# Patient Record
Sex: Male | Born: 1970 | Race: White | Hispanic: No | Marital: Married | Smoking: Former smoker
Health system: Southern US, Community
[De-identification: ages and names within clinical notes are randomized; demographics above are authoritative.]

## PROBLEM LIST (undated history)

## (undated) DIAGNOSIS — K219 Gastro-esophageal reflux disease without esophagitis: Secondary | ICD-10-CM

## (undated) DIAGNOSIS — Z86010 Personal history of colonic polyps: Secondary | ICD-10-CM

## (undated) DIAGNOSIS — Z860101 Personal history of adenomatous and serrated colon polyps: Secondary | ICD-10-CM

## (undated) DIAGNOSIS — G4700 Insomnia, unspecified: Secondary | ICD-10-CM

## (undated) DIAGNOSIS — I10 Essential (primary) hypertension: Secondary | ICD-10-CM

## (undated) DIAGNOSIS — R059 Cough, unspecified: Secondary | ICD-10-CM

## (undated) DIAGNOSIS — E785 Hyperlipidemia, unspecified: Secondary | ICD-10-CM

## (undated) DIAGNOSIS — T7840XA Allergy, unspecified, initial encounter: Secondary | ICD-10-CM

## (undated) HISTORY — DX: Essential (primary) hypertension: I10

## (undated) HISTORY — PX: FINGER SURGERY: SHX640

## (undated) HISTORY — DX: Insomnia, unspecified: G47.00

## (undated) HISTORY — DX: Hyperlipidemia, unspecified: E78.5

## (undated) HISTORY — DX: Personal history of adenomatous and serrated colon polyps: Z86.0101

## (undated) HISTORY — PX: WISDOM TOOTH EXTRACTION: SHX21

## (undated) HISTORY — DX: Gastro-esophageal reflux disease without esophagitis: K21.9

## (undated) HISTORY — DX: Allergy, unspecified, initial encounter: T78.40XA

## (undated) HISTORY — DX: Personal history of colonic polyps: Z86.010

## (undated) HISTORY — DX: Cough, unspecified: R05.9

---
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
## 2006-12-16 DIAGNOSIS — M8618 Other acute osteomyelitis, other site: Secondary | ICD-10-CM | POA: Insufficient documentation

## 2006-12-17 ENCOUNTER — Ambulatory Visit: Payer: Self-pay | Admitting: Infectious Diseases

## 2006-12-21 ENCOUNTER — Encounter: Payer: Self-pay | Admitting: Infectious Diseases

## 2006-12-25 ENCOUNTER — Telehealth: Payer: Self-pay | Admitting: Infectious Diseases

## 2016-05-15 ENCOUNTER — Other Ambulatory Visit: Payer: Self-pay | Admitting: Family Medicine

## 2016-05-15 DIAGNOSIS — R7989 Other specified abnormal findings of blood chemistry: Secondary | ICD-10-CM

## 2016-05-15 DIAGNOSIS — R945 Abnormal results of liver function studies: Principal | ICD-10-CM

## 2016-05-25 ENCOUNTER — Other Ambulatory Visit: Payer: Self-pay

## 2016-06-01 ENCOUNTER — Other Ambulatory Visit: Payer: Self-pay

## 2017-04-17 ENCOUNTER — Ambulatory Visit
Admission: RE | Admit: 2017-04-17 | Discharge: 2017-04-17 | Disposition: A | Discharge status: Home / Self Care | Payer: No Typology Code available for payment source | Source: Ambulatory Visit | Attending: Family Medicine | Admitting: Family Medicine

## 2017-04-17 DIAGNOSIS — R945 Abnormal results of liver function studies: Principal | ICD-10-CM

## 2017-04-17 DIAGNOSIS — R7989 Other specified abnormal findings of blood chemistry: Secondary | ICD-10-CM

## 2019-08-24 IMAGING — US US ABDOMEN LIMITED
1 series · 14 of 25 positions shown · non-contrast
Comparison: None.

CLINICAL DATA: Elevated LFTs

EXAM:
ULTRASOUND ABDOMEN LIMITED RIGHT UPPER QUADRANT

[Series 1: us abdomen limited · 0.23mm/px · 14 of 45 slices shown]
[im 1/45]
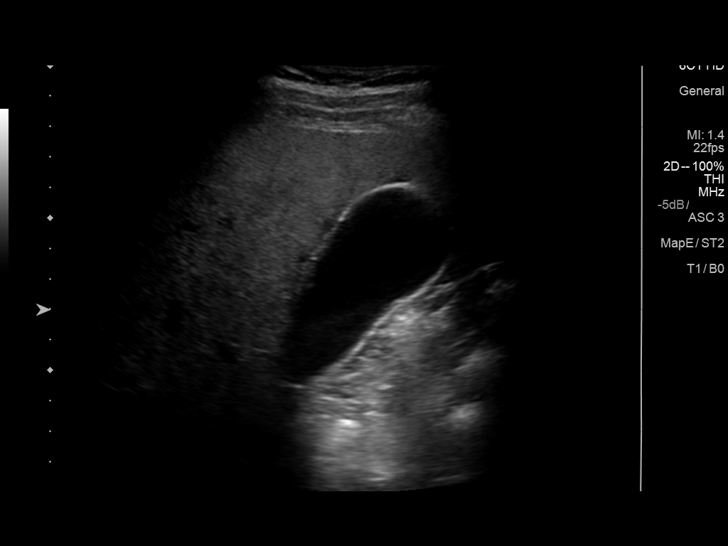
[im 4/45]
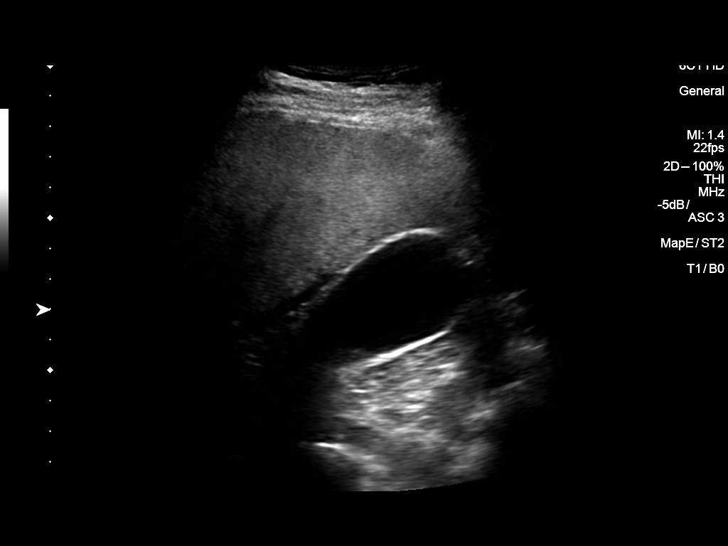
[im 8/45]
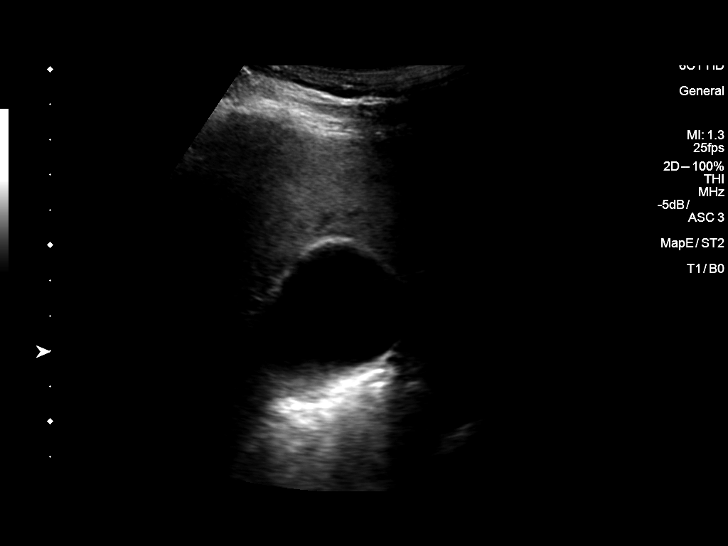
[im 12/45]
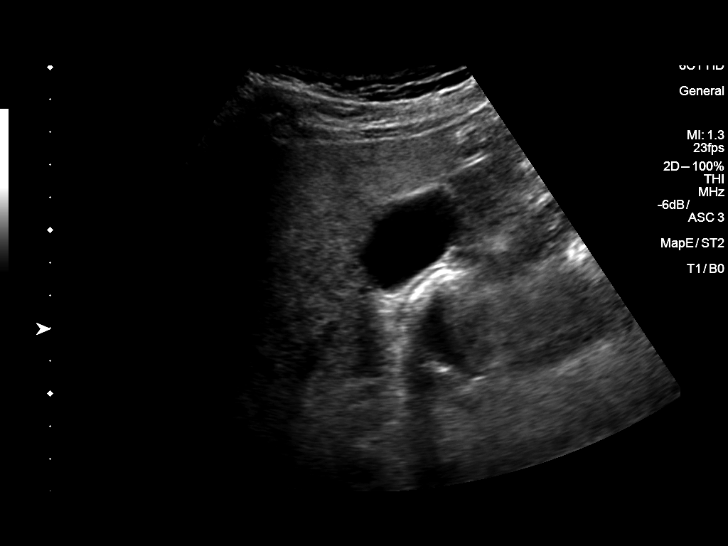
[im 15/45]
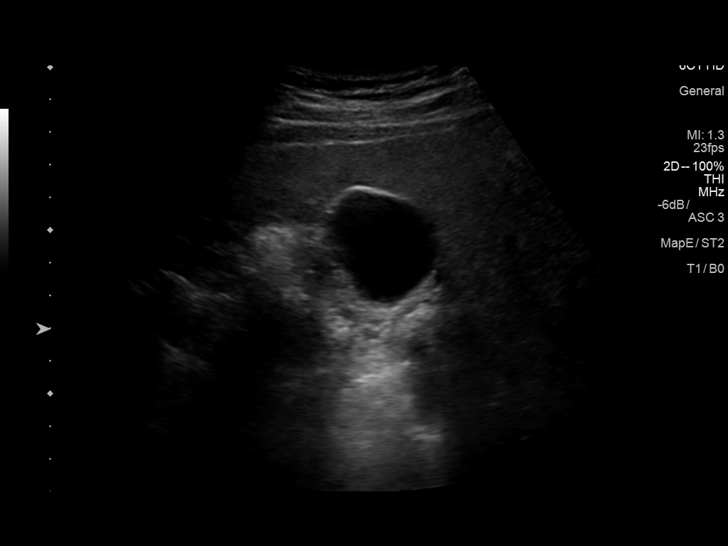
[im 17/45]
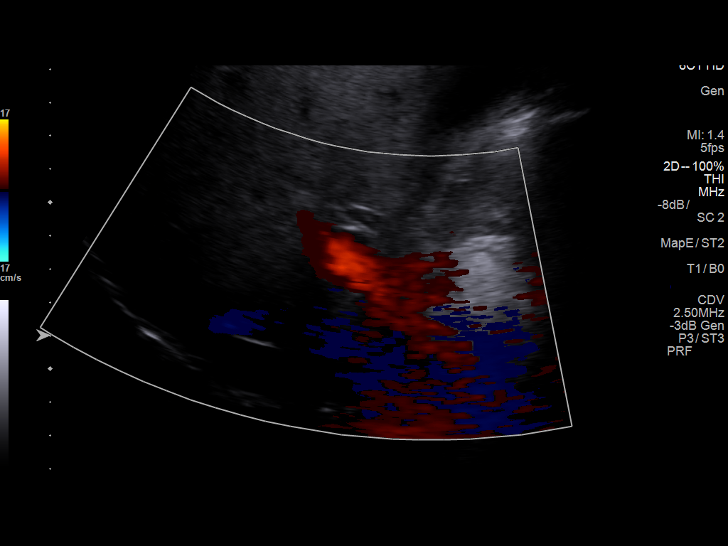
[im 21/45]
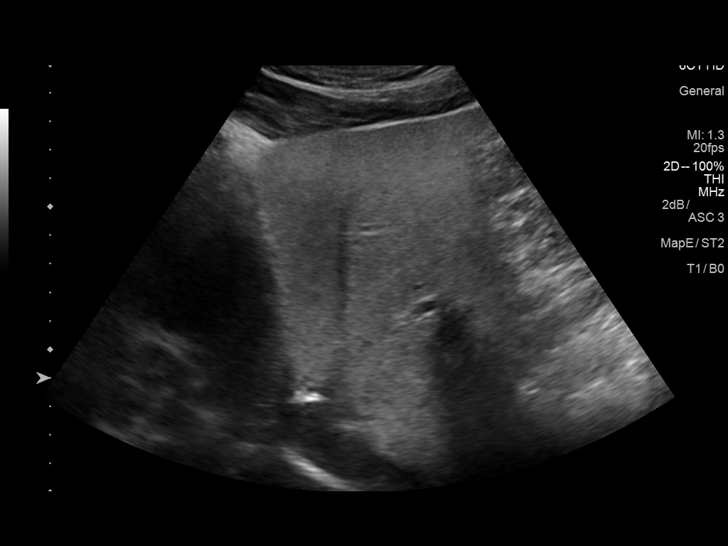
[im 24/45]
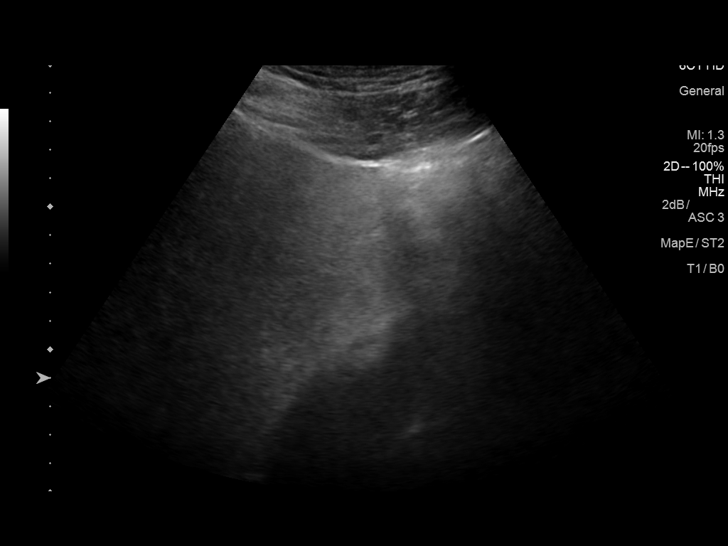
[im 28/45]
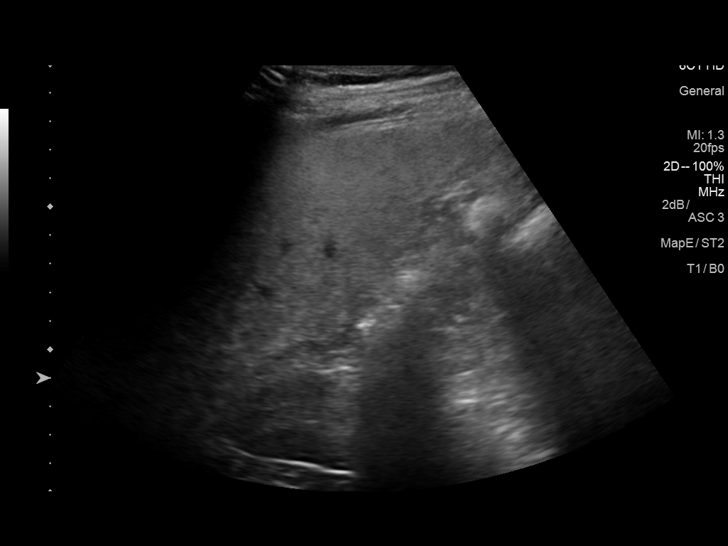
[im 30/45]
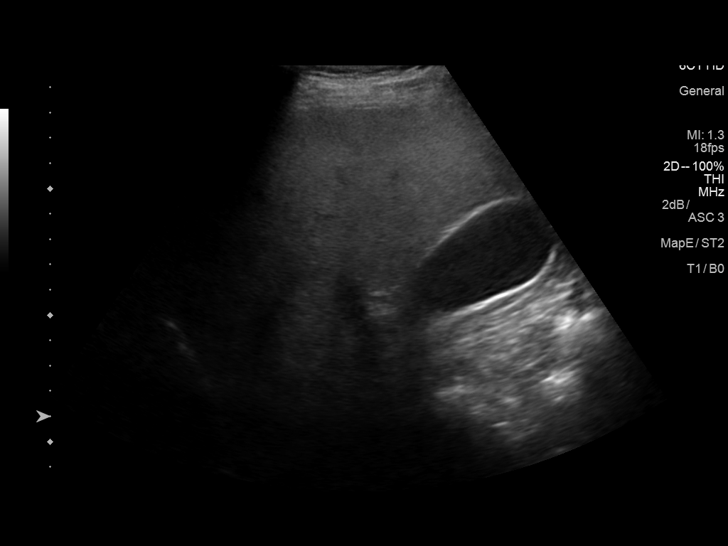
[im 34/45]
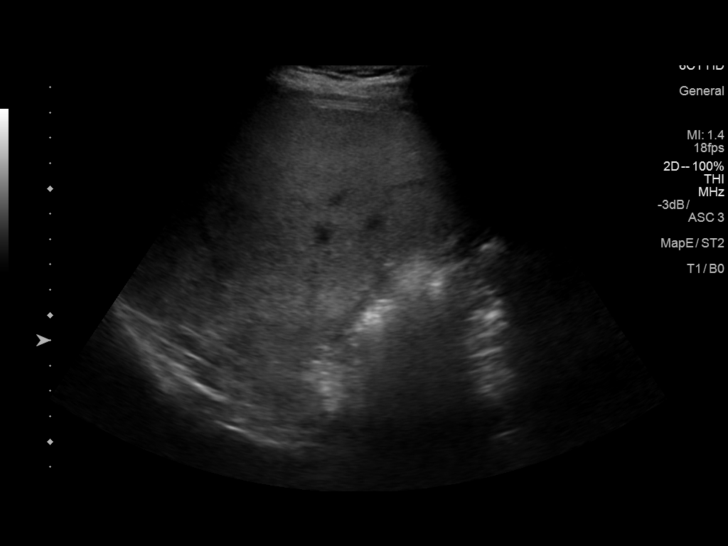
[im 37/45]
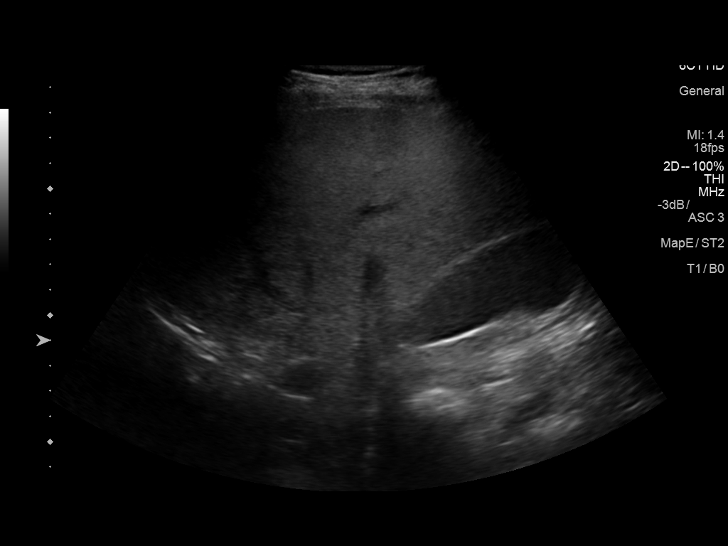
[im 41/45]
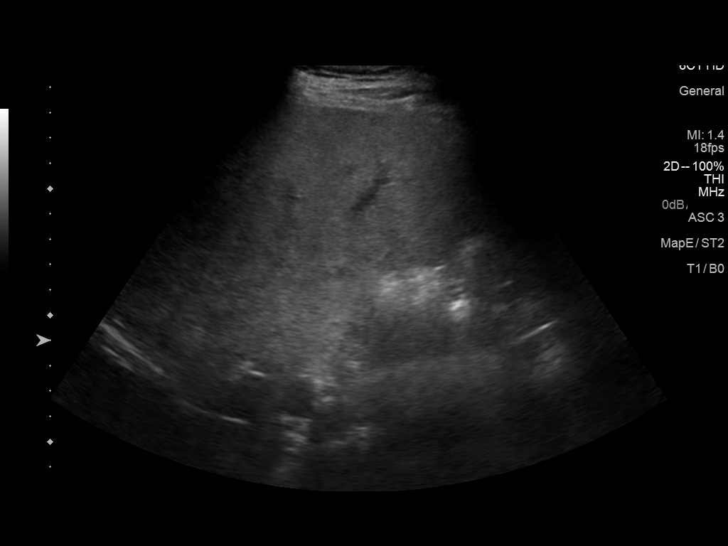
[im 45/45]
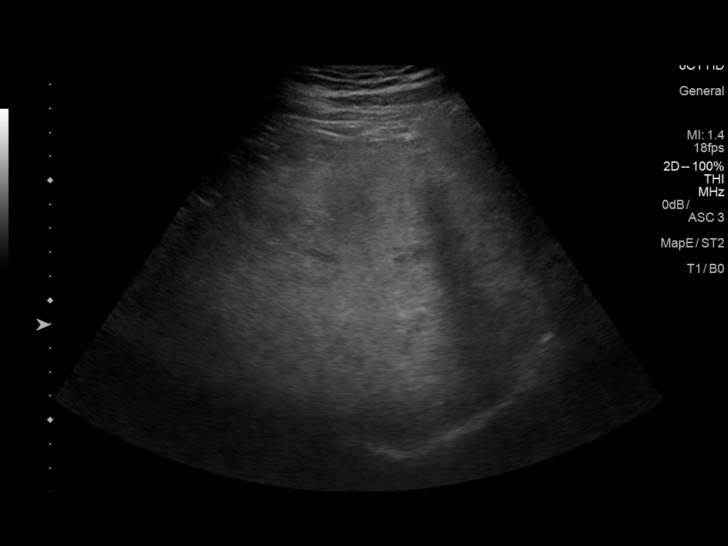

[14 of 25 positions shown; findings below may reference images not displayed]

FINDINGS: Gallbladder:

No gallstones or wall thickening visualized. No sonographic Murphy
sign noted by sonographer.

Common bile duct:

Diameter: 5.7 mm.

Liver:

Mild increased echogenicity is noted consistent with fatty
infiltration. No mass lesion is noted. Portal vein is patent on
color Doppler imaging with normal direction of blood flow towards
the liver.
IMPRESSION: Fatty liver.

## 2019-12-09 DIAGNOSIS — U071 COVID-19: Secondary | ICD-10-CM

## 2019-12-09 HISTORY — DX: COVID-19: U07.1

## 2020-01-10 LAB — COLOGUARD: COLOGUARD: POSITIVE — AB

## 2020-02-08 ENCOUNTER — Ambulatory Visit (INDEPENDENT_AMBULATORY_CARE_PROVIDER_SITE_OTHER): Payer: No Typology Code available for payment source

## 2020-02-08 ENCOUNTER — Encounter: Payer: Self-pay | Admitting: Pulmonary Disease

## 2020-02-08 ENCOUNTER — Other Ambulatory Visit: Payer: Self-pay

## 2020-02-08 ENCOUNTER — Ambulatory Visit (INDEPENDENT_AMBULATORY_CARE_PROVIDER_SITE_OTHER): Payer: No Typology Code available for payment source | Admitting: Pulmonary Disease

## 2020-02-08 VITALS — BP 124/74 | HR 93 | Temp 98.2°F | Ht 71.0 in | Wt 194.0 lb

## 2020-02-08 DIAGNOSIS — R059 Cough, unspecified: Secondary | ICD-10-CM

## 2020-02-08 NOTE — Patient Instructions (Signed)
Chronic cough  We will get a chest x-ray today Schedule pulmonary function test  We will consider CT scan of the chest if there is any abnormalities on the chest x-ray  Follow-up 4 to 6 weeks

## 2020-02-08 NOTE — Progress Notes (Signed)
Roberto Briggs    [id number]    1970/10/29  Primary Care Physician:Swayne, Shanon Brow, MD  Referring Physician: Antony Contras, MD [place],  [place] [address]  Chief complaint:   Chronic cough  HPI:  Patient has had a cough for over 10 years he stated Recently more concerned as his spouse has been telling him to have it checked out  Usually just cough in the mornings  Does have some history of reflux which usually does not bother him much unless he eats spicy foods Denies any nasal stuffiness or congestion No history of asthma or wheezing  Reformed smoker half a pack to 1 pack a day quit in 1998  No weight loss No history of hemoptysis  Dad did have lung cancer, heavy smoker  Denies any other ongoing chronic health problems  Outpatient Encounter Medications as of 02/08/2020  Medication Sig  . CRESTOR 10 MG tablet Take 10 mg by mouth daily.  Marland Kitchen olmesartan (BENICAR) 20 MG tablet Take 20 mg by mouth daily.  Marland Kitchen testosterone cypionate (DEPOTESTOTERONE CYPIONATE) 100 MG/ML injection ADMINISTER 1 ML IN THE MUSCLE EVERY 1 WEEK  . VASCEPA 1 g capsule Take 2 g by mouth 2 (two) times daily.   No facility-administered encounter medications on file as of 02/08/2020.    Allergies as of 02/08/2020 - Review Complete 02/08/2020  Allergen Reaction Noted  . Penicillins      No past medical history on file.  No family history on file.  Social History   Socioeconomic History  . Marital status: Married    Spouse name: Not on file  . Number of children: Not on file  . Years of education: Not on file  . Highest education level: Not on file  Occupational History  . Not on file  Tobacco Use  . Smoking status: Former Smoker    Quit date: 01/09/1996    Years since quitting: 24.0  . Smokeless tobacco: Never Used  Substance and Sexual Activity  . Alcohol use: Not on file  . Drug use: Not on file  . Sexual activity: Not on file  Other Topics Concern  .  Not on file  Social History Narrative  . Not on file   Social Determinants of Health   Financial Resource Strain: Not on file  Food Insecurity: Not on file  Transportation Needs: Not on file  Physical Activity: Not on file  Stress: Not on file  Social Connections: Not on file  Intimate Partner Violence: Not on file    Review of Systems  Constitutional: Negative for fatigue.  HENT: Negative.   Eyes: Negative.   Respiratory: Positive for cough. Negative for shortness of breath.   Cardiovascular: Negative.   Gastrointestinal: Negative.   Musculoskeletal: Negative.     Vitals:   02/08/20 1448  BP: 124/74  Pulse: 93  Temp: 98.2 F (36.8 C)  SpO2: 98%     Physical Exam Constitutional:      Appearance: Normal appearance.  HENT:     Head: Normocephalic.  Cardiovascular:     Rate and Rhythm: Normal rate and regular rhythm.     Pulses: Normal pulses.     Heart sounds: No murmur heard. No friction rub.  Pulmonary:     Effort: Pulmonary effort is normal. No respiratory distress.     Breath sounds: No stridor. No wheezing or rhonchi.  Musculoskeletal:     Cervical back: No rigidity or tenderness.  Neurological:     Mental Status: He is alert.  Psychiatric:        Mood and Affect: Mood normal.    Data Reviewed: No previous evaluation for the cough  Assessment:  Chronic cough -Reformed smoker -Family history of lung cancer in his dad who was a smoker as well -No symptoms to suggest postnasal drip or allergies -No history suggesting obstructive airway disease although with a history of smoking in the past  Reformed smoker  Plan/Recommendations: Obtain a chest x-ray -If any abnormality on the x-ray then a CT scan of the chest may be obtained  Obtain a pulmonary function test   Tentative follow-up in 4 weeks  Sherrilyn Rist MD [facility] 02/08/2020, 3:12 PM  CC: Antony Contras, MD

## 2020-02-25 ENCOUNTER — Encounter: Payer: Self-pay | Admitting: Internal Medicine

## 2020-03-08 ENCOUNTER — Ambulatory Visit (AMBULATORY_SURGERY_CENTER): Payer: No Typology Code available for payment source | Admitting: *Deleted

## 2020-03-08 ENCOUNTER — Other Ambulatory Visit: Payer: Self-pay

## 2020-03-08 VITALS — Ht 71.5 in | Wt 190.0 lb

## 2020-03-08 DIAGNOSIS — R195 Other fecal abnormalities: Secondary | ICD-10-CM

## 2020-03-08 HISTORY — PX: COLONOSCOPY: SHX5424

## 2020-03-08 NOTE — Progress Notes (Signed)
Pt verified name, DOB, address and insurance during PV today. Pt mailed instruction packet to included paper to complete and mail back to [facility] with addressed and stamped envelope, Emmi video, copy of consent form to read and not return, and instructions. PV completed over the phone.  Pt encouraged to call with questions or issues   No egg or soy allergy known to patient  No issues with past sedation with any surgeries or procedures No intubation problems in the past  No FH of Malignant Hyperthermia No diet pills per patient No home 02 use per patient  No blood thinners per patient  Pt denies issues with constipation  No A fib or A flutter  EMMI video to pt or via [place] 19 guidelines implemented in PV today with Pt and RN     Due to the COVID-19 pandemic we are asking patients to follow certain guidelines.  Pt aware of COVID protocols and [facility] guidelines

## 2020-03-10 ENCOUNTER — Encounter: Payer: Self-pay | Admitting: Internal Medicine

## 2020-03-15 ENCOUNTER — Telehealth: Payer: Self-pay | Admitting: Internal Medicine

## 2020-03-15 NOTE — Telephone Encounter (Signed)
Pt instructed Miralax works like Suprep as long as he follows the instructions given- pt verbalized understanding

## 2020-03-15 NOTE — Telephone Encounter (Signed)
Pt is requesting a call back from a nurse to ask if the Wilcox would work better for him than the dulcolax and miralax

## 2020-03-18 ENCOUNTER — Other Ambulatory Visit (HOSPITAL_COMMUNITY): Payer: No Typology Code available for payment source

## 2020-03-21 ENCOUNTER — Ambulatory Visit: Payer: No Typology Code available for payment source | Admitting: Pulmonary Disease

## 2020-03-22 ENCOUNTER — Encounter: Payer: Self-pay | Admitting: Internal Medicine

## 2020-03-22 ENCOUNTER — Other Ambulatory Visit: Payer: Self-pay

## 2020-03-22 ENCOUNTER — Ambulatory Visit (AMBULATORY_SURGERY_CENTER): Payer: No Typology Code available for payment source | Admitting: Internal Medicine

## 2020-03-22 VITALS — BP 100/74 | HR 67 | Temp 96.2°F | Resp 13 | Ht 71.0 in | Wt 190.0 lb

## 2020-03-22 DIAGNOSIS — R195 Other fecal abnormalities: Secondary | ICD-10-CM

## 2020-03-22 DIAGNOSIS — D123 Benign neoplasm of transverse colon: Secondary | ICD-10-CM

## 2020-03-22 DIAGNOSIS — K648 Other hemorrhoids: Secondary | ICD-10-CM

## 2020-03-22 DIAGNOSIS — K5730 Diverticulosis of large intestine without perforation or abscess without bleeding: Secondary | ICD-10-CM

## 2020-03-22 MED ORDER — SODIUM CHLORIDE 0.9 % IV SOLN
500.0000 mL | Freq: Once | INTRAVENOUS | Status: DC
Start: 2020-03-22 — End: 2020-03-22

## 2020-03-22 NOTE — Progress Notes (Signed)
Report to PACU, RN, vss, BBS= Clear.  

## 2020-03-22 NOTE — Progress Notes (Signed)
C.W. vital signs.

## 2020-03-22 NOTE — Progress Notes (Signed)
Pt's states no medical or surgical changes since previsit or office visit. 

## 2020-03-22 NOTE — Op Note (Signed)
[name] Patient Name: Roberto Briggs Procedure Date: 03/22/2020 9:08 AM MRN: [id number] Endoscopist: Gatha Mayer , MD Age: 50 Referring MD:  Date of Birth: Sep 04, 1970 Gender: Male Account #: [id number] Procedure:                Colonoscopy Indications:              Positive Cologuard test Medicines:                Propofol per Anesthesia, Monitored Anesthesia Care Procedure:                Pre-Anesthesia Assessment:                           - Prior to the procedure, a History and Physical                            was performed, and patient medications and                            allergies were reviewed. The patient's tolerance of                            previous anesthesia was also reviewed. The risks                            and benefits of the procedure and the sedation                            options and risks were discussed with the patient.                            All questions were answered, and informed consent                            was obtained. Prior Anticoagulants: The patient has                            taken no previous anticoagulant or antiplatelet                            agents. ASA Grade Assessment: II - A patient with                            mild systemic disease. After reviewing the risks                            and benefits, the patient was deemed in                            satisfactory condition to undergo the procedure.                           After obtaining informed consent, the colonoscope  was passed under direct vision. Throughout the                            procedure, the patient's blood pressure, pulse, and                            oxygen saturations were monitored continuously. The                            Olympus CF-HQ190L [id number]) Colonoscope was                            introduced through the anus and advanced to the the                            cecum, identified  by appendiceal orifice and                            ileocecal valve. The colonoscopy was performed                            without difficulty. The patient tolerated the                            procedure well. The quality of the bowel                            preparation was adequate. The ileocecal valve,                            appendiceal orifice, and rectum were photographed.                            The bowel preparation used was Miralax via split                            dose instruction. Scope In: 9:21:21 AM Scope Out: 9:39:12 AM Scope Withdrawal Time: 0 hours 15 minutes 15 seconds  Total Procedure Duration: 0 hours 17 minutes 51 seconds  Findings:                 The perianal and digital rectal examinations were                            normal. Pertinent negatives include normal prostate                            (size, shape, and consistency).                           A diminutive polyp was found in the transverse                            colon. The polyp was sessile. The polyp was removed  with a cold snare. Resection and retrieval were                            complete. Verification of patient identification                            for the specimen was done. Estimated blood loss was                            minimal.                           Multiple diverticula were found in the sigmoid                            colon.                           External hemorrhoids were found.                           The exam was otherwise without abnormality on                            direct and retroflexion views. Complications:            No immediate complications. Estimated Blood Loss:     Estimated blood loss was minimal. Impression:               - One diminutive polyp in the transverse colon,                            removed with a cold snare. Resected and retrieved.                           - Diverticulosis in the  sigmoid colon.                           - External hemorrhoids.                           - The examination was otherwise normal on direct                            and retroflexion views. Recommendation:           - Patient has a contact number available for                            emergencies. The signs and symptoms of potential                            delayed complications were discussed with the                            patient. Return to normal activities tomorrow.  Written discharge instructions were provided to the                            patient.                           - Resume previous diet.                           - Continue present medications.                           - Repeat colonoscopy is recommended. The                            colonoscopy date will be determined after pathology                            results from today's exam become available for                            review. Gatha Mayer, MD 03/22/2020 9:45:50 AM This report has been signed electronically.

## 2020-03-22 NOTE — Patient Instructions (Addendum)
I found and removed 1 tiny polyp.  There are some hemorrhoids that is probably what test positive.  You also have a condition called diverticulosis.  This is common and not typically a problem.  We have some explanatory handouts for you.  I will let you know pathology results and when to have another routine colonoscopy by mail and/or My Chart.  I appreciate the opportunity to care for you. Gatha Mayer, MD, [facility]  Handouts given for polyps, diverticulosis and hemorrhoids.   YOU HAD AN ENDOSCOPIC PROCEDURE TODAY AT [facility]:   Refer to the procedure report that was given to you for any specific questions about what was found during the examination.  If the procedure report does not answer your questions, please call your gastroenterologist to clarify.  If you requested that your care partner not be given the details of your procedure findings, then the procedure report has been included in a sealed envelope for you to review at your convenience later.  YOU SHOULD EXPECT: Some feelings of bloating in the abdomen. Passage of more gas than usual.  Walking can help get rid of the air that was put into your GI tract during the procedure and reduce the bloating. If you had a lower endoscopy (such as a colonoscopy or flexible sigmoidoscopy) you may notice spotting of blood in your stool or on the toilet paper. If you underwent a bowel prep for your procedure, you may not have a normal bowel movement for a few days.  Please Note:  You might notice some irritation and congestion in your nose or some drainage.  This is from the oxygen used during your procedure.  There is no need for concern and it should clear up in a day or so.  SYMPTOMS TO REPORT IMMEDIATELY:   Following lower endoscopy (colonoscopy or flexible sigmoidoscopy):  Excessive amounts of blood in the stool  Significant tenderness or worsening of abdominal pains  Swelling of the abdomen that is new, acute  Fever of  100F or higher  For urgent or emergent issues, a gastroenterologist can be reached at any hour by calling [phone]. Do not use MyChart messaging for urgent concerns.    DIET:  We do recommend a small meal at first, but then you may proceed to your regular diet.  Drink plenty of fluids but you should avoid alcoholic beverages for 24 hours.  ACTIVITY:  You should plan to take it easy for the rest of today and you should NOT DRIVE or use heavy machinery until tomorrow (because of the sedation medicines used during the test).    FOLLOW UP: Our staff will call the number listed on your records 48-72 hours following your procedure to check on you and address any questions or concerns that you may have regarding the information given to you following your procedure. If we do not reach you, we will leave a message.  We will attempt to reach you two times.  During this call, we will ask if you have developed any symptoms of COVID 19. If you develop any symptoms (ie: fever, flu-like symptoms, shortness of breath, cough etc.) before then, please call [phone].  If you test positive for Covid 19 in the 2 weeks post procedure, please call and report this information to [place].    If any biopsies were taken you will be contacted by phone or by letter within the next 1-3 weeks.  Please call us at [phone] if you have  not heard about the biopsies in 3 weeks.    SIGNATURES/CONFIDENTIALITY: You and/or your care partner have signed paperwork which will be entered into your electronic medical record.  These signatures attest to the fact that that the information above on your After Visit Summary has been reviewed and is understood.  Full responsibility of the confidentiality of this discharge information lies with you and/or your care-partner.

## 2020-03-24 ENCOUNTER — Telehealth: Payer: Self-pay

## 2020-03-24 ENCOUNTER — Telehealth: Payer: Self-pay | Admitting: *Deleted

## 2020-03-24 NOTE — Telephone Encounter (Signed)
  Follow up Call-  Call back number 03/22/2020  Post procedure Call Back phone  # [phone]  Permission to leave phone message Yes  Some recent data might be hidden     Patient questions:  Do you have a fever, pain , or abdominal swelling? No. Pain Score  0 *  Have you tolerated food without any problems? Yes.    Have you been able to return to your normal activities? Yes.    Do you have any questions about your discharge instructions: Diet   No. Medications  No. Follow up visit  No.  Do you have questions or concerns about your Care? No.  Actions: * If pain score is 4 or above: No action needed, pain <4.  1. Have you developed a fever since your procedure? no  2.   Have you had an respiratory symptoms (SOB or cough) since your procedure? no  3.   Have you tested positive for COVID 19 since your procedure no  4.   Have you had any family members/close contacts diagnosed with the COVID 19 since your procedure?  no   If yes to any of these questions please route to Joylene John, RN and Joella Prince, RN

## 2020-03-24 NOTE — Telephone Encounter (Deleted)
  Follow up Call-  Call back number 03/22/2020  Post procedure Call Back phone  # [phone]  Permission to leave phone message Yes  Some recent data might be hidden     Patient questions:  Do you have a fever, pain , or abdominal swelling? {yes no:314532} Pain Score  {NUMBERS; 0-10:5044} *  Have you tolerated food without any problems? {yes no:314532}  Have you been able to return to your normal activities? {yes no:314532}  Do you have any questions about your discharge instructions: Diet   {yes no:314532} Medications  {yes no:314532} Follow up visit  {yes no:314532}  Do you have questions or concerns about your Care? {yes no:314532}  Actions: * If pain score is 4 or above: {ACTION; [facility] ENDO PAIN >4:21563::"No action needed, pain <4."}

## 2020-03-24 NOTE — Telephone Encounter (Signed)
Called # [phone] and left a message we tried to reach pt for a follow up call. maw

## 2020-03-26 ENCOUNTER — Other Ambulatory Visit (HOSPITAL_COMMUNITY): Payer: No Typology Code available for payment source

## 2020-03-26 ENCOUNTER — Other Ambulatory Visit (HOSPITAL_COMMUNITY)
Admission: RE | Admit: 2020-03-26 | Discharge: 2020-03-26 | Disposition: A | Discharge status: Home / Self Care | Payer: No Typology Code available for payment source | Source: Ambulatory Visit | Attending: Pulmonary Disease | Admitting: Pulmonary Disease

## 2020-03-26 DIAGNOSIS — U071 COVID-19: Secondary | ICD-10-CM | POA: Diagnosis not present

## 2020-03-26 DIAGNOSIS — Z01812 Encounter for preprocedural laboratory examination: Secondary | ICD-10-CM | POA: Insufficient documentation

## 2020-03-26 LAB — SARS CORONAVIRUS 2 (TAT 6-24 HRS): SARS Coronavirus 2: POSITIVE — AB

## 2020-03-27 ENCOUNTER — Telehealth: Payer: Self-pay | Admitting: Physician Assistant

## 2020-03-27 ENCOUNTER — Telehealth: Payer: Self-pay | Admitting: Pulmonary Disease

## 2020-03-27 NOTE — Telephone Encounter (Signed)
Called to discuss with patient about COVID-19 symptoms and the use of one of the available treatments for those with mild to moderate Covid symptoms and at a high risk of hospitalization.  Pt appears to qualify for outpatient treatment due to co-morbid conditions and/or a member of an at-risk group in accordance with the FDA Emergency Use Authorization.   Unable to reach pt. LVMTCB.  [employer]

## 2020-03-27 NOTE — Telephone Encounter (Signed)
Notified by [facility] lab that the patient tested positive for COVID. Has been contacted already by outpatient treatment center. Routing to pulmonary office as he is scheduled for PFT which will need to be rescheduled

## 2020-03-27 NOTE — Progress Notes (Deleted)
Notified Dr. Lake Bells, on call for Dr. Ander Slade, of patient's positive covid test.

## 2020-03-28 NOTE — Telephone Encounter (Signed)
Aware  Please reschedule PFT

## 2020-03-28 NOTE — Telephone Encounter (Signed)
Called spoke with patient He was okay following up with a NP patient has been rescheduled for June per office protocol.   Nothing further needed at this time.

## 2020-03-30 ENCOUNTER — Ambulatory Visit: Payer: No Typology Code available for payment source | Admitting: Pulmonary Disease

## 2020-04-01 ENCOUNTER — Encounter: Payer: Self-pay | Admitting: Internal Medicine

## 2020-07-01 ENCOUNTER — Other Ambulatory Visit (HOSPITAL_COMMUNITY): Payer: No Typology Code available for payment source

## 2020-07-04 ENCOUNTER — Ambulatory Visit: Payer: No Typology Code available for payment source | Admitting: Primary Care

## 2021-02-14 ENCOUNTER — Ambulatory Visit (INDEPENDENT_AMBULATORY_CARE_PROVIDER_SITE_OTHER): Payer: No Typology Code available for payment source | Admitting: Medical

## 2021-02-14 ENCOUNTER — Other Ambulatory Visit: Payer: Self-pay

## 2021-02-14 ENCOUNTER — Encounter: Payer: Self-pay | Admitting: Medical

## 2021-02-14 VITALS — BP 134/88 | HR 78 | Temp 98.5°F | Ht 71.25 in | Wt 193.8 lb

## 2021-02-14 DIAGNOSIS — R053 Chronic cough: Secondary | ICD-10-CM | POA: Diagnosis not present

## 2021-02-14 DIAGNOSIS — Z801 Family history of malignant neoplasm of trachea, bronchus and lung: Secondary | ICD-10-CM | POA: Diagnosis not present

## 2021-02-14 DIAGNOSIS — R9389 Abnormal findings on diagnostic imaging of other specified body structures: Secondary | ICD-10-CM

## 2021-02-14 DIAGNOSIS — I10 Essential (primary) hypertension: Secondary | ICD-10-CM

## 2021-02-14 DIAGNOSIS — Z87891 Personal history of nicotine dependence: Secondary | ICD-10-CM

## 2021-02-14 DIAGNOSIS — E785 Hyperlipidemia, unspecified: Secondary | ICD-10-CM

## 2021-02-14 MED ORDER — BENZONATATE 200 MG PO CAPS: 200.0000 mg | ORAL_CAPSULE | Freq: Three times a day (TID) | ORAL | 0 refills | Status: DC | PRN

## 2021-02-14 NOTE — Progress Notes (Addendum)
Subjective:  Roberto Briggs is a 51 y.o. male who presents for Chief Complaint  Patient presents with   other    New pt. Est. Ongoing cough since having last covid back in 12/21, may want lung cancer screening dad pasted last 9/22 from lung cancer 2nd hand smoke form parents growing up     Here as a new patient.  Was seeing [facility] prior.  Medical team: Urology, Dr. Louis Meckel  He notes ongoing cough.  Had covid 12/2019.  Has had some ongoing cough since a year ago.   Worse in mornings, then it calms down but flares up again in afternoon.   Has had chest xray in past year that was clear.   He notes hx/o 2nd hand tobacco exposure, would like lung cancer screen.  Father just passed of lung cancer, diagnosed in 2021, had metastases.  He is a former smoke, but quit in 1998.     Denies frequency GERD issues, only occasionally with spicy food.   Gets some runny nose sometimes.  No other allergy symptoms  Works for home in [place].  Low testosterone, sees urology.  Uses testosterone injection every 10 days  Hypertension - compliant with Olmesartan 36m daily, increased this past year. Usually around 122-124/80.  No other aggravating or relieving factors.    No other c/o.  Past Medical History:  Diagnosis Date   Allergy    Cough    in the am's only- to see Pulmonology    COVID-19 12/2019   GERD (gastroesophageal reflux disease)    OCC    Hx of adenomatous polyp of colon    diminutive recall 2029   Hyperlipidemia    controlled on meds    Hypertension    Controlled with meds    Current Outpatient Medications on File Prior to Visit  Medication Sig Dispense Refill   BD DISP NEEDLES 22G X 1-1/2" MISC      CRESTOR 10 MG tablet Take 10 mg by mouth daily.     Multiple Vitamin (MULTIVITAMIN ADULT PO)      olmesartan (BENICAR) 40 MG tablet Take 40 mg by mouth daily.     sildenafil (REVATIO) 20 MG tablet      testosterone cypionate (DEPOTESTOSTERONE CYPIONATE) 200 MG/ML injection Inject 100  mg into the muscle once a week. On Sunday     ALPRAZolam (XANAX) 0.25 MG tablet  (Patient not taking: Reported on 03/08/2020)     VASCEPA 1 g capsule Take 2 g by mouth 2 (two) times daily. (Patient not taking: Reported on 02/14/2021)     zolpidem (AMBIEN) 10 MG tablet  (Patient not taking: Reported on 03/08/2020)     No current facility-administered medications on file prior to visit.     The following portions of the patient's history were reviewed and updated as appropriate: allergies, current medications, past family history, past medical history, past social history, past surgical history and problem list.  ROS Otherwise as in subjective above    Objective: BP 134/88    Pulse 78    Temp 98.5 F (36.9 C)    Ht 5' 11.25" (1.81 m)    Wt 193 lb 12.8 oz (87.9 kg)    SpO2 98%    BMI 26.84 kg/m   General appearance: alert, no distress, well developed, well nourished HEENT: normocephalic, sclerae anicteric, conjunctiva pink and moist, TMs pearly, nares patent, no discharge or erythema, pharynx normal Oral cavity: MMM, no lesions Neck: supple, no lymphadenopathy, no thyromegaly, no masses  Heart: RRR, normal S1, S2, no murmurs Lungs: CTA bilaterally, no wheezes, rhonchi, or rales Pulses: 2+ radial pulses, 2+ pedal pulses, normal cap refill Ext: no edema   Assessment: Encounter Diagnoses  Name Primary?   Chronic cough Yes   Abnormal chest x-ray    Former smoker    Family history of lung cancer    Essential hypertension, benign    Hyperlipidemia, unspecified hyperlipidemia type      Plan: Chronic cough, abnormal chest x-ray about a year ago.  We discussed his concerns, family history of lung cancer.  It is not obvious which trigger is causing the cough currently.  We will go ahead and proceed with CT chest.  He is a former smoker.  He has no significant current symptoms for allergies or acid reflux.  He has no work exposures of concern.  Can use Tessalon Perles as needed.  Follow-up  pending CT  Hypertension-compliant with medication  Hyperlipidemia-compliant with medication  Low testosterone-managed by urology  He is signing for prior records to be sent over including last physical notes and labs   [name] was seen today for other.  Diagnoses and all orders for this visit:  Chronic cough -     CT Chest W Contrast; Future -     Spirometry with graph; Future -     Spirometry with graph  Abnormal chest x-ray -     CT Chest W Contrast; Future  Former smoker -     CT Chest W Contrast; Future  Family history of lung cancer -     CT Chest W Contrast; Future  Essential hypertension, benign  Hyperlipidemia, unspecified hyperlipidemia type  Other orders -     benzonatate (TESSALON) 200 MG capsule; Take 1 capsule (200 mg total) by mouth 3 (three) times daily as needed for cough.    Follow up: pending CT

## 2021-02-23 ENCOUNTER — Telehealth: Payer: Self-pay | Admitting: Medical

## 2021-02-23 NOTE — Telephone Encounter (Signed)
Received records from [facility]

## 2021-03-03 ENCOUNTER — Ambulatory Visit
Admission: RE | Admit: 2021-03-03 | Discharge: 2021-03-03 | Disposition: A | Discharge status: Home / Self Care | Payer: No Typology Code available for payment source | Source: Ambulatory Visit | Attending: Medical | Admitting: Medical

## 2021-03-03 DIAGNOSIS — R053 Chronic cough: Secondary | ICD-10-CM

## 2021-03-03 DIAGNOSIS — Z87891 Personal history of nicotine dependence: Secondary | ICD-10-CM

## 2021-03-03 DIAGNOSIS — Z801 Family history of malignant neoplasm of trachea, bronchus and lung: Secondary | ICD-10-CM

## 2021-03-03 DIAGNOSIS — R9389 Abnormal findings on diagnostic imaging of other specified body structures: Secondary | ICD-10-CM

## 2021-03-03 MED ORDER — IOPAMIDOL (ISOVUE-300) INJECTION 61%
75.0000 mL | Freq: Once | INTRAVENOUS | Status: AC | PRN
  Administered 2021-03-03: 75 mL via INTRAVENOUS

## 2021-03-16 NOTE — Telephone Encounter (Signed)
Roberto Briggs- can you check to see if records came in on this patient? ?

## 2021-03-17 ENCOUNTER — Other Ambulatory Visit: Payer: Self-pay

## 2021-03-17 ENCOUNTER — Other Ambulatory Visit: Payer: Self-pay | Admitting: Medical

## 2021-03-17 MED ORDER — ATORVASTATIN CALCIUM 20 MG PO TABS: 20.0000 mg | ORAL_TABLET | Freq: Every day | ORAL | 3 refills | Status: DC

## 2021-03-20 ENCOUNTER — Other Ambulatory Visit: Payer: Self-pay | Admitting: Medical

## 2021-03-20 ENCOUNTER — Telehealth: Payer: Self-pay | Admitting: Family Medicine

## 2021-03-20 MED ORDER — ROSUVASTATIN CALCIUM 20 MG PO TABS: 20.0000 mg | ORAL_TABLET | Freq: Every day | ORAL | 0 refills | Status: DC

## 2021-03-20 MED ORDER — CRESTOR 20 MG PO TABS: 20.0000 mg | ORAL_TABLET | Freq: Every day | ORAL | 0 refills | Status: DC

## 2021-03-20 NOTE — Telephone Encounter (Signed)
Patient called and DOES NOT LIKE LIPITOR.  He wants Crestor and it needs to be written DAW. ?He thinks you increased his dose to 20 mg instead of 27m.  Please [facility] and [facility].  I have sched cpe in July. ?

## 2021-03-20 NOTE — Telephone Encounter (Signed)
Please advise as lipitor was sent in last week by kim ? ?

## 2021-05-31 ENCOUNTER — Other Ambulatory Visit: Payer: Self-pay | Admitting: Medical

## 2021-07-31 ENCOUNTER — Encounter: Payer: Self-pay | Admitting: Medical

## 2021-07-31 ENCOUNTER — Ambulatory Visit (INDEPENDENT_AMBULATORY_CARE_PROVIDER_SITE_OTHER): Payer: No Typology Code available for payment source | Admitting: Medical

## 2021-07-31 VITALS — BP 120/80 | HR 81 | Wt 193.4 lb

## 2021-07-31 DIAGNOSIS — G4700 Insomnia, unspecified: Secondary | ICD-10-CM | POA: Diagnosis not present

## 2021-07-31 DIAGNOSIS — E785 Hyperlipidemia, unspecified: Secondary | ICD-10-CM | POA: Diagnosis not present

## 2021-07-31 DIAGNOSIS — Z0000 Encounter for general adult medical examination without abnormal findings: Secondary | ICD-10-CM

## 2021-07-31 DIAGNOSIS — Z125 Encounter for screening for malignant neoplasm of prostate: Secondary | ICD-10-CM

## 2021-07-31 DIAGNOSIS — I10 Essential (primary) hypertension: Secondary | ICD-10-CM

## 2021-07-31 DIAGNOSIS — N5201 Erectile dysfunction due to arterial insufficiency: Secondary | ICD-10-CM

## 2021-07-31 DIAGNOSIS — R7989 Other specified abnormal findings of blood chemistry: Secondary | ICD-10-CM

## 2021-07-31 DIAGNOSIS — Z7185 Encounter for immunization safety counseling: Secondary | ICD-10-CM | POA: Insufficient documentation

## 2021-07-31 LAB — POCT URINALYSIS DIP (PROADVANTAGE DEVICE)
Bilirubin, UA: NEGATIVE
Blood, UA: NEGATIVE
Glucose, UA: NEGATIVE mg/dL
Ketones, POC UA: NEGATIVE mg/dL
Leukocytes, UA: NEGATIVE
Nitrite, UA: NEGATIVE
Protein Ur, POC: NEGATIVE mg/dL
Specific Gravity, Urine: 1.01
Urobilinogen, Ur: 0.2
pH, UA: 6 (ref 5.0–8.0)

## 2021-07-31 NOTE — Progress Notes (Signed)
Subjective:   HPI  Roberto Briggs is a 51 y.o. male who presents for Chief Complaint  Patient presents with   Annual Exam    Fasting Cpe- no other concerns    Patient Care Team: Olina Melfi, Leward Quan as PCP - General (Family Medicine) Sees dentist Sees eye doctor Dr. Silvano Rusk, GI Dr. 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  No complaints of medication.  He still might want to go back and do a CT coronary test  He notes that he uses Ambien and Xanax for travel.  This is rare use.  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  His last injection was about 10 days ago.  Sees urology next month for yearly follow-up  He is curious about Berberine supplement for triglycerides.  He has been on other things in the past that did not help all that much which had triglycerides and prescription fish oil was very expensive  Reviewed their medical, surgical, family, social, medication, and allergy history and updated chart as appropriate.  Past Medical History:  Diagnosis Date   Allergy    Cough    in the am's only- to see Pulmonology    COVID-19 12/2019   GERD (gastroesophageal reflux disease)    OCC    Hx of adenomatous polyp of colon    diminutive recall 2029   Hyperlipidemia    controlled on meds    Hypertension    Controlled with meds     Past Surgical History:  Procedure Laterality Date   EYE SURGERY  1982   lazy eye    FINGER SURGERY     ~2008 or 2009   WISDOM TOOTH EXTRACTION      Family History  Problem Relation Age of Onset   Prostate cancer Maternal Grandfather        could be colon - pt unsure    Colon polyps Father    Lung cancer Father    Colon cancer Neg Hx    Esophageal cancer Neg Hx    Stomach cancer Neg Hx     Rectal cancer Neg Hx      Current Outpatient Medications:    ALPRAZolam (XANAX) 0.25 MG tablet, , Disp: , Rfl:    BD DISP NEEDLES 22G X 1-1/2" MISC, , Disp: , Rfl:    CRESTOR 20 MG tablet, TAKE 1 TABLET DAILY, Disp: 90 tablet, Rfl: 0   Multiple Vitamin (MULTIVITAMIN ADULT PO), , Disp: , Rfl:    olmesartan (BENICAR) 40 MG tablet, Take 40 mg by mouth daily., Disp: , Rfl:    sildenafil (REVATIO) 20 MG tablet, , Disp: , Rfl:    testosterone cypionate (DEPOTESTOSTERONE CYPIONATE) 200 MG/ML injection, Inject 100 mg into the muscle once a week. On Sunday, Disp: , Rfl:    zolpidem (AMBIEN) 10 MG tablet, , Disp: , Rfl:   Allergies  Allergen Reactions   Penicillins    Sertraline Hcl Other (See Comments)    Didn't tolerate     Review of Systems Constitutional: -fever, -chills, -sweats, -unexpected weight change, -decreased appetite, -fatigue Allergy: -sneezing, -itching, -congestion Dermatology: -changing moles, --rash, -lumps ENT: -runny nose, -ear pain, -sore throat, -hoarseness, -sinus pain, -teeth pain, - ringing in ears, -hearing loss, -nosebleeds Cardiology: -chest pain, -palpitations, -swelling, -difficulty breathing when  lying flat, -waking up short of breath Respiratory: -cough, -shortness of breath, -difficulty breathing with exercise or exertion, -wheezing, -coughing up blood Gastroenterology: -abdominal pain, -nausea, -vomiting, -diarrhea, -constipation, -blood in stool, -changes in bowel movement, -difficulty swallowing or eating Hematology: -bleeding, -bruising  Musculoskeletal: -joint aches, -muscle aches, -joint swelling, -back pain, -neck pain, -cramping, -changes in gait Ophthalmology: denies vision changes, eye redness, itching, discharge Urology: -burning with urination, -difficulty urinating, -blood in urine, -urinary frequency, -urgency, -incontinence Neurology: -headache, -weakness, -tingling, -numbness, -memory loss, -falls, -dizziness Psychology: -depressed mood,  -agitation, -sleep problems Male GU: no testicular mass, pain, no lymph nodes swollen, no swelling, no rash.     07/31/2021    8:51 AM 02/17/2021    8:28 AM  Depression screen PHQ 2/9  Decreased Interest 0 0  Down, Depressed, Hopeless 0 0  PHQ - 2 Score 0 0        Objective:  BP 120/80   Pulse 81   Wt 193 lb 6.4 oz (87.7 kg)   SpO2 98%   BMI 26.78 kg/m   General appearance: alert, no distress, WD/WN, Caucasian male Skin: scattered macules, no worrisome lesions HEENT: normocephalic, conjunctiva/corneas normal, sclerae anicteric, PERRLA, EOMi, nares patent, no discharge or erythema, pharynx normal Neck: supple, no lymphadenopathy, no thyromegaly, no masses, normal ROM, no bruits Chest: non tender, normal shape and expansion Heart: RRR, normal S1, S2, no murmurs Lungs: CTA bilaterally, no wheezes, rhonchi, or rales Abdomen: +bs, soft, non tender, non distended, no masses, no hepatomegaly, no splenomegaly, no bruits Back: non tender, normal ROM, no scoliosis Musculoskeletal: upper extremities non tender, no obvious deformity, normal ROM throughout, lower extremities non tender, no obvious deformity, normal ROM throughout Extremities: no edema, no cyanosis, no clubbing Pulses: 2+ symmetric, upper and lower extremities, normal cap refill Neurological: alert, oriented x 3, CN2-12 intact, strength normal upper extremities and lower extremities, sensation normal throughout, DTRs 2+ throughout, no cerebellar signs, gait normal Psychiatric: normal affect, behavior normal, pleasant  GU: normal male external genitalia,circumcised, nontender, no masses, no hernia, no lymphadenopathy Rectal: deferred to urology   Assessment and Plan :   Encounter Diagnoses  Name Primary?   Encounter for health maintenance examination in adult Yes   Hyperlipidemia, unspecified hyperlipidemia type    Screening for prostate cancer    Insomnia, unspecified type    Vaccine counseling    Low testosterone     Primary hypertension    Erectile dysfunction due to arterial insufficiency     This visit was a preventative care visit, also known as wellness visit or routine physical.   Topics typically include healthy lifestyle, diet, exercise, preventative care, vaccinations, sick and well care, proper use of emergency dept and after hours care, as well as other concerns.     Recommendations: Continue to return yearly for your annual wellness and preventative care visits.  This gives [place] a chance to discuss healthy lifestyle, exercise, vaccinations, review your chart record, and perform screenings where appropriate.  I recommend you see your eye doctor yearly for routine vision care.  I recommend you see your dentist yearly for routine dental care including hygiene visits twice yearly.   Vaccination recommendations were reviewed  There is no immunization history on file for this patient.  Advised yearly flu shot, updated Tdap, Shingrix age 33  He declines Tdap today   Screening for cancer: Colon cancer screening: I reviewed your colonoscopy on file that is up to date from 2022, repeat 2029.  We discussed PSA, prostate  exam, and prostate cancer screening risks/benefits.     Skin cancer screening: Check your skin regularly for new changes, growing lesions, or other lesions of concern Come in for evaluation if you have skin lesions of concern.  Lung cancer screening: If you have a greater than 20 pack year history of tobacco use, then you may qualify for lung cancer screening with a chest CT scan.   Please call your insurance company to inquire about coverage for this test.  We currently don't have screenings for other cancers besides breast, cervical, colon, and lung cancers.  If you have a strong family history of cancer or have other cancer screening concerns, please let me know.    Bone health: Get at least 150 minutes of aerobic exercise weekly Get weight bearing exercise at least  once weekly Bone density test:  A bone density test is an imaging test that uses a type of X-ray to measure the amount of calcium and other minerals in your bones. The test may be used to diagnose or screen you for a condition that causes weak or thin bones (osteoporosis), predict your risk for a broken bone (fracture), or determine how well your osteoporosis treatment is working. The bone density test is recommended for females 70 and older, or females or males <03 if certain risk factors such as thyroid disease, long term use of steroids such as for asthma or rheumatological issues, vitamin D deficiency, estrogen deficiency, family history of osteoporosis, self or family history of fragility fracture in first degree relative.    Heart health: Get at least 150 minutes of aerobic exercise weekly Limit alcohol It is important to maintain a healthy blood pressure and healthy cholesterol numbers  Heart disease screening: Screening for heart disease includes screening for blood pressure, fasting lipids, glucose/diabetes screening, BMI height to weight ratio, reviewed of smoking status, physical activity, and diet.    Goals include blood pressure 120/80 or less, maintaining a healthy lipid/cholesterol profile, preventing diabetes or keeping diabetes numbers under good control, not smoking or using tobacco products, exercising most days per week or at least 150 minutes per week of exercise, and eating healthy variety of fruits and vegetables, healthy oils, and avoiding unhealthy food choices like fried food, fast food, high sugar and high cholesterol foods.    Other tests may possibly include EKG test, CT coronary calcium score, echocardiogram, exercise treadmill stress test.   CT chest 2023 shows very small punctate area of calcifications in the aortic arch, no other atherosclerosis noted   Medical care options: I recommend you continue to seek care here first for routine care.  We try really hard  to have available appointments Monday through Friday daytime hours for sick visits, acute visits, and physicals.  Urgent care should be used for after hours and weekends for significant issues that cannot wait till the next day.  The emergency department should be used for significant potentially life-threatening emergencies.  The emergency department is expensive, can often have long wait times for less significant concerns, so try to utilize primary care, urgent care, or telemedicine when possible to avoid unnecessary trips to the emergency department.  Virtual visits and telemedicine have been introduced since the pandemic started in 2020, and can be convenient ways to receive medical care.  We offer virtual appointments as well to assist you in a variety of options to seek medical care.   Advanced Directives: I recommend you consider completing a [place] and Living Will.  These documents respect your wishes and help alleviate burdens on your loved ones if you were to become terminally ill or be in a position to need those documents enforced.    You can complete Advanced Directives yourself, have them notarized, then have copies made for our office, for you and for anybody you feel should have them in safe keeping.  Or, you can have an attorney prepare these documents.   If you haven't updated your Last Will and Testament in a while, it may be worthwhile having an attorney prepare these documents together and save on some costs.       Separate significant issues discussed: Hypertension-continue current medication  Hyperlipidemia-compliant with Crestor 20 mg.  Updated fasting labs today.  Counseled on lipids and high triglycerides and possible treatment recommendations.  I will research the berberine supplement he was referring to.  Travel anxiety and insomnia-uses Xanax and Ambien rarely for travel  Low testosterone-labs today to be forwarded to [facility] urology who  manages his testosterone  Of note, I reviewed prior records from [place] from physical a year ago in 2022.  At that time he had triglycerides around 202, total cholesterol 170, AST slightly elevated at 43, bilirubin at 1.4 elevated, otherwise comprehensive metabolic panel normal, PSA 1.17   Paolo was seen today for annual exam.  Diagnoses and all orders for this visit:  Encounter for health maintenance examination in adult -     Comprehensive metabolic panel -     CBC -     Lipid panel -     POCT Urinalysis DIP (Proadvantage Device) -     PSA -     HIV Antibody (routine testing w rflx) -     Hepatitis C antibody -     Testosterone  Hyperlipidemia, unspecified hyperlipidemia type -     Lipid panel  Screening for prostate cancer -     PSA  Insomnia, unspecified type  Vaccine counseling  Low testosterone -     Testosterone  Primary hypertension  Erectile dysfunction due to arterial insufficiency   Follow-up pending labs, yearly for physical

## 2021-08-01 LAB — CBC
Hematocrit: 50.9 % (ref 37.5–51.0)
Hemoglobin: 17.1 g/dL (ref 13.0–17.7)
MCH: 32.3 pg (ref 26.6–33.0)
MCHC: 33.6 g/dL (ref 31.5–35.7)
MCV: 96 fL (ref 79–97)
Platelets: 182 10*3/uL (ref 150–450)
RBC: 5.29 x10E6/uL (ref 4.14–5.80)
RDW: 12.8 % (ref 11.6–15.4)
WBC: 5.3 10*3/uL (ref 3.4–10.8)

## 2021-08-01 LAB — COMPREHENSIVE METABOLIC PANEL
ALT: 44 IU/L (ref 0–44)
AST: 37 IU/L (ref 0–40)
Albumin/Globulin Ratio: 2.3 — ABNORMAL HIGH (ref 1.2–2.2)
Albumin: 4.9 g/dL (ref 4.1–5.1)
Alkaline Phosphatase: 49 IU/L (ref 44–121)
BUN/Creatinine Ratio: 9 (ref 9–20)
BUN: 9 mg/dL (ref 6–24)
Bilirubin Total: 0.9 mg/dL (ref 0.0–1.2)
CO2: 22 mmol/L (ref 20–29)
Calcium: 9.9 mg/dL (ref 8.7–10.2)
Chloride: 99 mmol/L (ref 96–106)
Creatinine, Ser: 1.01 mg/dL (ref 0.76–1.27)
Globulin, Total: 2.1 g/dL (ref 1.5–4.5)
Glucose: 91 mg/dL (ref 70–99)
Potassium: 4.7 mmol/L (ref 3.5–5.2)
Sodium: 137 mmol/L (ref 134–144)
Total Protein: 7 g/dL (ref 6.0–8.5)
eGFR: 91 mL/min/{1.73_m2} (ref >59)

## 2021-08-01 LAB — LIPID PANEL
Chol/HDL Ratio: 4 ratio (ref 0.0–5.0)
Cholesterol, Total: 184 mg/dL (ref 100–199)
HDL: 46 mg/dL (ref >39)
LDL Chol Calc (NIH): 99 mg/dL (ref 0–99)
Triglycerides: 232 mg/dL — ABNORMAL HIGH (ref 0–149)
VLDL Cholesterol Cal: 39 mg/dL (ref 5–40)

## 2021-08-01 LAB — PSA: Prostate Specific Ag, Serum: 1.4 ng/mL (ref 0.0–4.0)

## 2021-08-01 LAB — HEPATITIS C ANTIBODY: Hep C Virus Ab: NONREACTIVE

## 2021-08-01 LAB — TESTOSTERONE: Testosterone: 286 ng/dL (ref 264–916)

## 2021-08-01 LAB — HIV ANTIBODY (ROUTINE TESTING W REFLEX): HIV Screen 4th Generation wRfx: NONREACTIVE

## 2021-08-29 ENCOUNTER — Other Ambulatory Visit: Payer: Self-pay | Admitting: Medical

## 2021-09-13 ENCOUNTER — Encounter: Payer: Self-pay | Admitting: Internal Medicine

## 2021-10-09 ENCOUNTER — Telehealth: Payer: Self-pay | Admitting: Medical

## 2021-10-09 ENCOUNTER — Other Ambulatory Visit: Payer: Self-pay | Admitting: Medical

## 2021-10-09 MED ORDER — OLMESARTAN MEDOXOMIL 40 MG PO TABS: 40.0000 mg | ORAL_TABLET | Freq: Every day | ORAL | 0 refills | Status: DC

## 2021-10-09 NOTE — Telephone Encounter (Signed)
I have refilled his bp medication. Please send in xanax

## 2021-10-09 NOTE — Telephone Encounter (Signed)
Pt called and is requesting a refill for his olmesartan [facility] [place] [place]     Pt also is wanting to know if you will send a refill for xanax states he gets it for travel  He would like that to go to [place], [place]

## 2021-10-10 ENCOUNTER — Other Ambulatory Visit: Payer: Self-pay | Admitting: Medical

## 2021-10-10 MED ORDER — ALPRAZOLAM 0.25 MG PO TABS: 0.2500 mg | ORAL_TABLET | Freq: Every evening | ORAL | 0 refills | Status: DC | PRN

## 2021-10-17 ENCOUNTER — Encounter: Payer: Self-pay | Admitting: Internal Medicine

## 2021-11-13 ENCOUNTER — Ambulatory Visit (INDEPENDENT_AMBULATORY_CARE_PROVIDER_SITE_OTHER): Payer: No Typology Code available for payment source | Admitting: Family Medicine

## 2021-11-13 ENCOUNTER — Encounter: Payer: Self-pay | Admitting: Family Medicine

## 2021-11-13 VITALS — BP 128/82 | HR 89 | Temp 98.6°F | Wt 197.2 lb

## 2021-11-13 DIAGNOSIS — L237 Allergic contact dermatitis due to plants, except food: Secondary | ICD-10-CM | POA: Diagnosis not present

## 2021-11-13 MED ORDER — METHYLPREDNISOLONE SODIUM SUCC 125 MG IJ SOLR
125.0000 mg | Freq: Once | INTRAMUSCULAR | Status: AC
  Administered 2021-11-13: 125 mg via INTRAMUSCULAR

## 2021-11-13 NOTE — Progress Notes (Signed)
   Subjective:    Patient ID: Roberto Briggs, male    DOB: Jun 11, 1970, 51 y.o.   MRN: [id number]  HPI He states that roughly 1 week ago he developed a rash after doing some work outside in the yard.  He was seen in an urgent care center and given a steroid Dosepak.  He was also given topical medications however in spite of that he apparently developed some new lesions on his torso bilaterally.  He did state that he washed with soap and water all of his close.   Review of Systems     Objective:   Physical Exam Erythematous dried lesions some of which were linear in nature.  These are noted on his arms, elbows and on lower extremities.  The lesions on his torso had a wheal-and-flare appearance to it.       Assessment & Plan:  Allergic contact dermatitis due to plants, except food - Plan: methylPREDNISolone sodium succinate (SOLU-MEDROL) 125 mg/2 mL injection 125 mg The lesions on his torso could easily be an id reaction.  I decided to give him some more steroid to see if that will help as well as use topical for the irritation.

## 2021-11-14 ENCOUNTER — Ambulatory Visit: Payer: No Typology Code available for payment source | Admitting: Medical

## 2021-12-20 ENCOUNTER — Other Ambulatory Visit: Payer: Self-pay | Admitting: Medical

## 2021-12-20 DIAGNOSIS — I10 Essential (primary) hypertension: Secondary | ICD-10-CM

## 2021-12-20 DIAGNOSIS — Z136 Encounter for screening for cardiovascular disorders: Secondary | ICD-10-CM

## 2022-01-02 ENCOUNTER — Other Ambulatory Visit: Payer: Self-pay | Admitting: Medical

## 2022-01-16 ENCOUNTER — Encounter: Payer: Self-pay | Admitting: Internal Medicine

## 2022-02-11 IMAGING — DX DG CHEST 2V
2 series · 2 of 2 positions shown · non-contrast
Comparison: None.

CLINICAL DATA: Cough

EXAM:
CHEST - 2 VIEW

[chest pa]
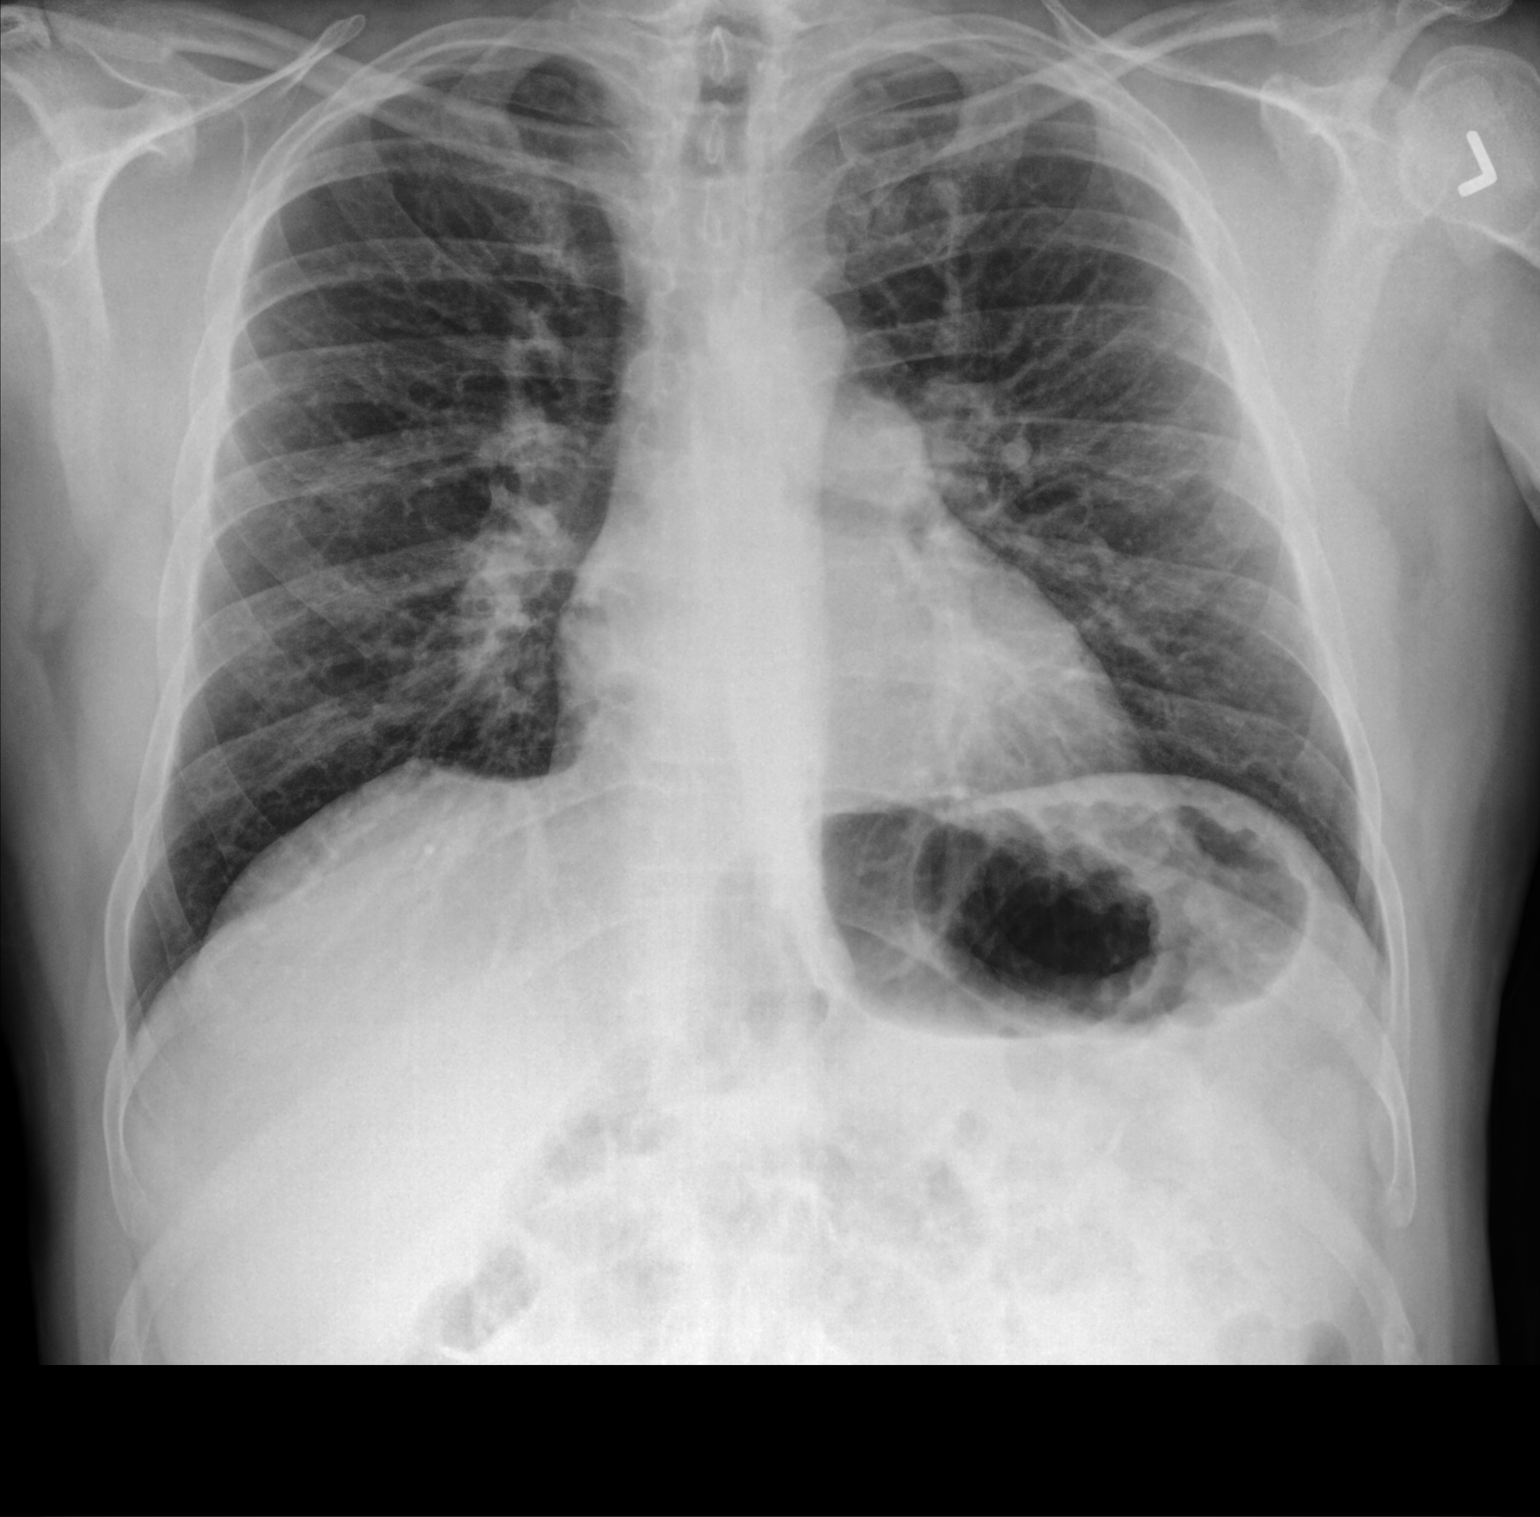

[chest lat]
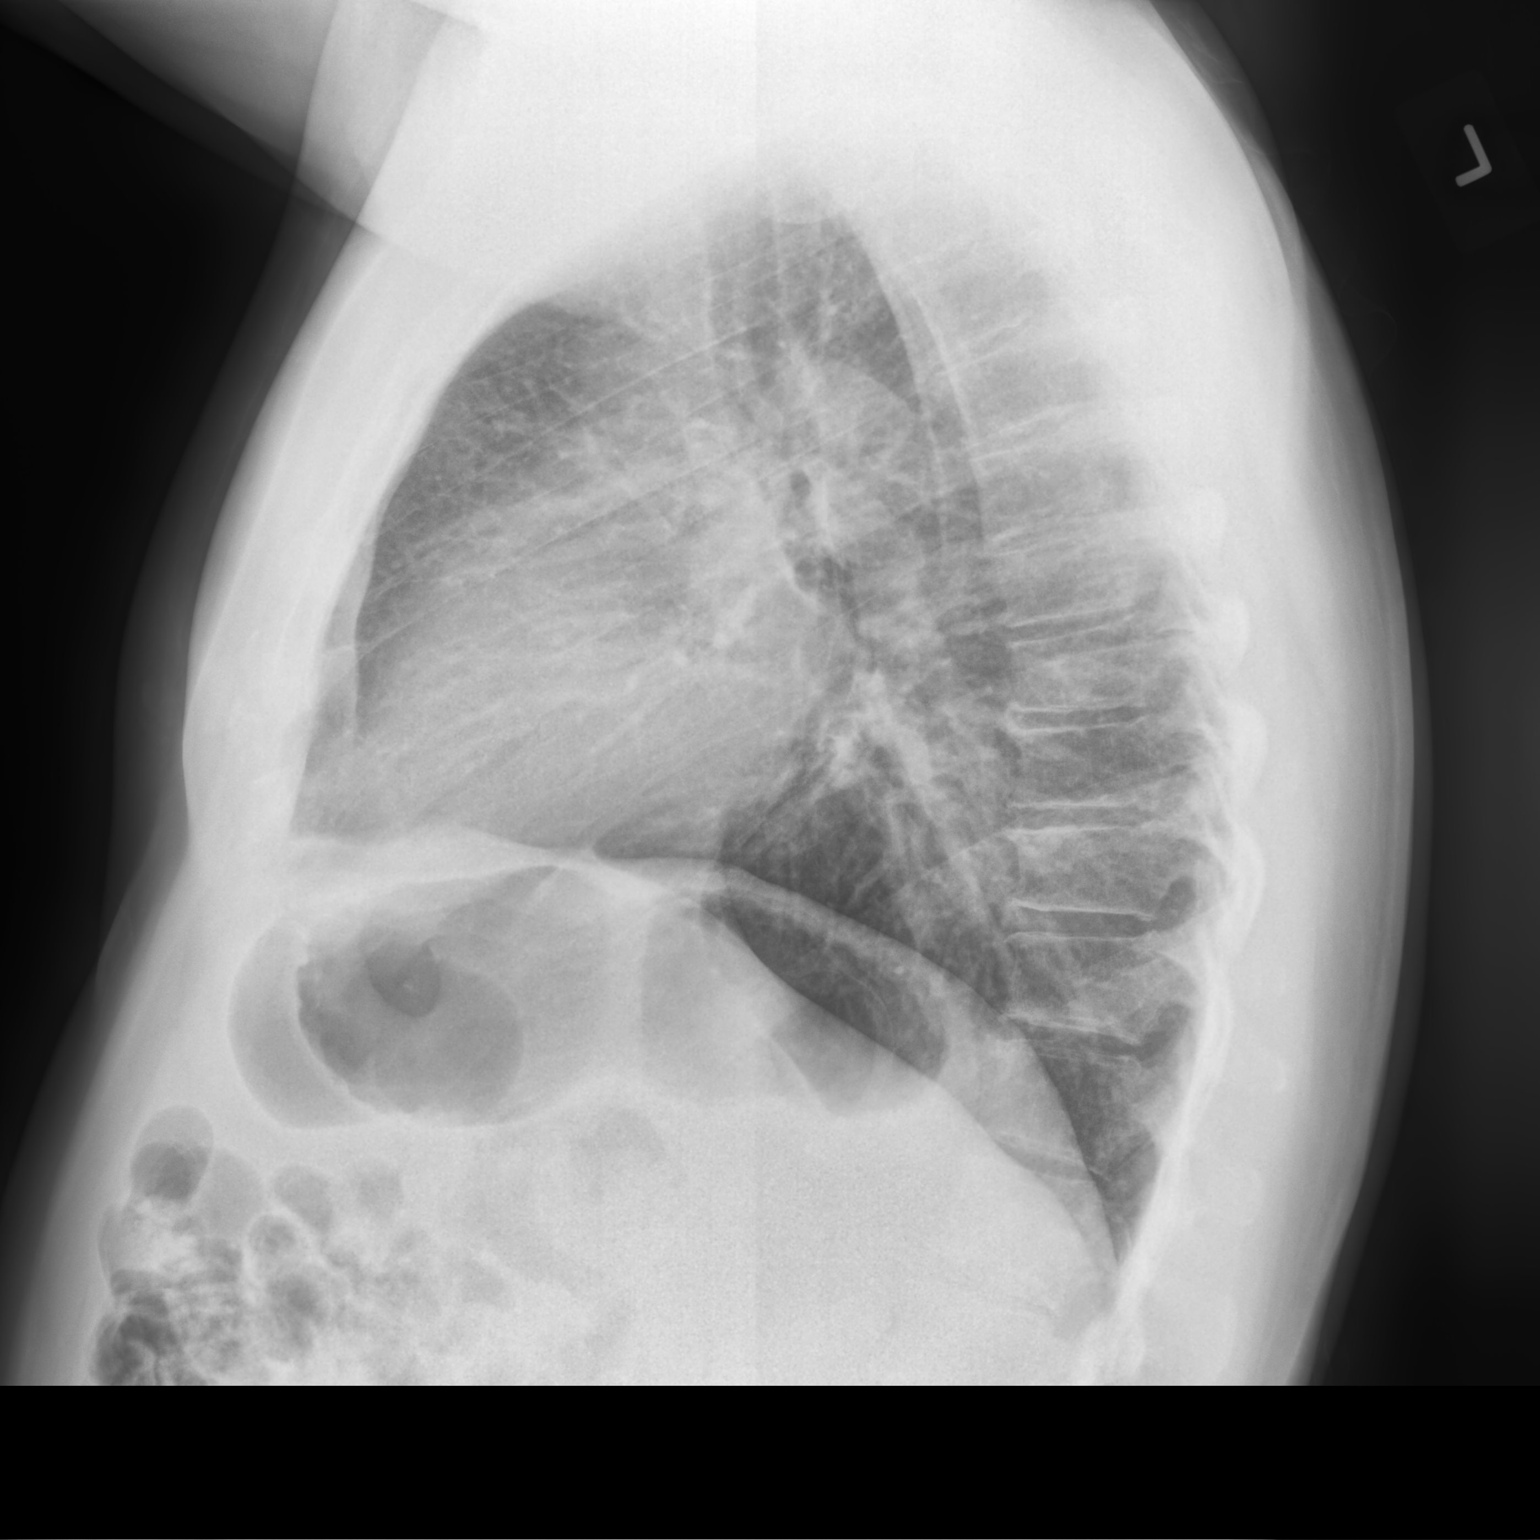

[2 of 2 positions shown; findings below may reference images not displayed]

FINDINGS: Cardiac shadow is within normal limits. The lungs are well aerated
bilaterally. Mild patchy airspace opacity is noted may represent
multifocal pneumonia. No effusion or pneumothorax is seen. No bony
abnormality is noted.
IMPRESSION: Mild patchy airspace opacity bilaterally suggestive of multifocal
pneumonia.

## 2022-04-02 ENCOUNTER — Other Ambulatory Visit: Payer: Self-pay | Admitting: Medical

## 2022-08-02 ENCOUNTER — Encounter: Payer: No Typology Code available for payment source | Admitting: Medical

## 2022-08-07 ENCOUNTER — Ambulatory Visit (INDEPENDENT_AMBULATORY_CARE_PROVIDER_SITE_OTHER): Payer: No Typology Code available for payment source | Admitting: Medical

## 2022-08-07 ENCOUNTER — Encounter: Payer: Self-pay | Admitting: Medical

## 2022-08-07 VITALS — BP 128/78 | HR 72 | Ht 71.0 in | Wt 198.2 lb

## 2022-08-07 DIAGNOSIS — Z136 Encounter for screening for cardiovascular disorders: Secondary | ICD-10-CM

## 2022-08-07 DIAGNOSIS — Z131 Encounter for screening for diabetes mellitus: Secondary | ICD-10-CM

## 2022-08-07 DIAGNOSIS — R7989 Other specified abnormal findings of blood chemistry: Secondary | ICD-10-CM | POA: Diagnosis not present

## 2022-08-07 DIAGNOSIS — Z7185 Encounter for immunization safety counseling: Secondary | ICD-10-CM

## 2022-08-07 DIAGNOSIS — E785 Hyperlipidemia, unspecified: Secondary | ICD-10-CM

## 2022-08-07 DIAGNOSIS — I10 Essential (primary) hypertension: Secondary | ICD-10-CM | POA: Diagnosis not present

## 2022-08-07 DIAGNOSIS — N5201 Erectile dysfunction due to arterial insufficiency: Secondary | ICD-10-CM | POA: Diagnosis not present

## 2022-08-07 DIAGNOSIS — R5383 Other fatigue: Secondary | ICD-10-CM

## 2022-08-07 DIAGNOSIS — Z125 Encounter for screening for malignant neoplasm of prostate: Secondary | ICD-10-CM | POA: Diagnosis not present

## 2022-08-07 DIAGNOSIS — Z0000 Encounter for general adult medical examination without abnormal findings: Secondary | ICD-10-CM | POA: Diagnosis not present

## 2022-08-07 LAB — POCT URINALYSIS DIP (PROADVANTAGE DEVICE)
Bilirubin, UA: NEGATIVE
Blood, UA: NEGATIVE
Glucose, UA: NEGATIVE mg/dL
Ketones, POC UA: NEGATIVE mg/dL
Leukocytes, UA: NEGATIVE
Nitrite, UA: NEGATIVE
Protein Ur, POC: NEGATIVE mg/dL
Specific Gravity, Urine: 1.005
Urobilinogen, Ur: 0.2
pH, UA: 6 (ref 5.0–8.0)

## 2022-08-07 LAB — COMPREHENSIVE METABOLIC PANEL
ALT: 45 IU/L — ABNORMAL HIGH (ref 0–44)
AST: 43 IU/L — ABNORMAL HIGH (ref 0–40)
Albumin: 5 g/dL (ref 4.1–5.1)
Alkaline Phosphatase: 58 IU/L (ref 44–121)
BUN/Creatinine Ratio: 9 (ref 9–20)
BUN: 9 mg/dL (ref 6–24)
Bilirubin Total: 1 mg/dL (ref 0.0–1.2)
CO2: 25 mmol/L (ref 20–29)
Calcium: 9.6 mg/dL (ref 8.7–10.2)
Chloride: 99 mmol/L (ref 96–106)
Creatinine, Ser: 1.01 mg/dL (ref 0.76–1.27)
Globulin, Total: 2 g/dL (ref 1.5–4.5)
Glucose: 91 mg/dL (ref 70–99)
Potassium: 4.5 mmol/L (ref 3.5–5.2)
Sodium: 139 mmol/L (ref 134–144)
Total Protein: 7 g/dL (ref 6.0–8.5)
eGFR: 91 mL/min/{1.73_m2} (ref >59)

## 2022-08-07 LAB — LIPID PANEL
Chol/HDL Ratio: 4 ratio (ref 0.0–5.0)
Cholesterol, Total: 177 mg/dL (ref 100–199)
HDL: 44 mg/dL (ref >39)
LDL Chol Calc (NIH): 94 mg/dL (ref 0–99)
Triglycerides: 230 mg/dL — ABNORMAL HIGH (ref 0–149)
VLDL Cholesterol Cal: 39 mg/dL (ref 5–40)

## 2022-08-07 LAB — CBC
Hematocrit: 50.8 % (ref 37.5–51.0)
Hemoglobin: 17.3 g/dL (ref 13.0–17.7)
MCH: 32.5 pg (ref 26.6–33.0)
MCHC: 34.1 g/dL (ref 31.5–35.7)
MCV: 96 fL (ref 79–97)
Platelets: 190 10*3/uL (ref 150–450)
RBC: 5.32 x10E6/uL (ref 4.14–5.80)
RDW: 13.1 % (ref 11.6–15.4)
WBC: 4.7 10*3/uL (ref 3.4–10.8)

## 2022-08-07 LAB — HEMOGLOBIN A1C
Est. average glucose Bld gHb Est-mCnc: 111 mg/dL
Hgb A1c MFr Bld: 5.5 % (ref 4.8–5.6)

## 2022-08-07 LAB — VITAMIN D 25 HYDROXY (VIT D DEFICIENCY, FRACTURES)

## 2022-08-07 LAB — PSA

## 2022-08-07 LAB — TESTOSTERONE

## 2022-08-07 MED ORDER — OLMESARTAN MEDOXOMIL 40 MG PO TABS: 40.0000 mg | ORAL_TABLET | Freq: Every day | ORAL | 3 refills | Status: DC

## 2022-08-07 MED ORDER — ZEPBOUND 2.5 MG/0.5ML ~~LOC~~ SOAJ: 2.5000 mg | SUBCUTANEOUS | 0 refills | Status: DC

## 2022-08-07 MED ORDER — CRESTOR 20 MG PO TABS: 20.0000 mg | ORAL_TABLET | Freq: Every day | ORAL | 3 refills | Status: DC

## 2022-08-07 NOTE — Progress Notes (Signed)
Subjective:   HPI  Roberto Briggs is a 52 y.o. male who presents for Chief Complaint  Patient presents with   Annual Exam    Fasting annual exam, could not give UA (will try on the way out). No concerns.     Patient Care Team: Tamerra Merkley, Cleda Mccreedy as PCP - General (Family Medicine) Sees dentist Sees eye doctor Dr. Stan Head, GI Dr. Marlou Porch, urology  Concerns: Currently exercise is walking.  Does some intermittent fasting.  Hypertension-compliant with olmesartan 40 mg daily  Hyperlipidemia-compliant with crestor 20mg  daily.  No complaints of medication.    Low testosterone-he self injects with testosterone, managed by urology  Wants to try wegovy or similar to help get some puonds off.  Despite exerise, can't get rid of some belly fat.    Reviewed their medical, surgical, family, social, medication, and allergy history and updated chart as appropriate.  Past Medical History:  Diagnosis Date   Allergy    Cough    in the am's only- to see Pulmonology    COVID-19 12/2019   GERD (gastroesophageal reflux disease)    OCC    Hx of adenomatous polyp of colon    diminutive recall 2029   Hyperlipidemia    controlled on meds    Hypertension    Controlled with meds    Insomnia     Past Surgical History:  Procedure Laterality Date   COLONOSCOPY  03/2020   repeat 2029, Dr. Stan Head   EYE SURGERY  1982   lazy eye    FINGER SURGERY     ~2008 or 2009   WISDOM TOOTH EXTRACTION      Family History  Problem Relation Age of Onset   Prostate cancer Maternal Grandfather        could be colon - pt unsure    Colon polyps Father    Lung cancer Father    Colon cancer Neg Hx    Esophageal cancer Neg Hx    Stomach cancer Neg Hx    Rectal cancer Neg Hx      Current Outpatient Medications:    BD DISP NEEDLES 22G X 1-1/2" MISC, , Disp: , Rfl:    cetirizine (ZYRTEC) 10 MG tablet, Take 10 mg by mouth daily., Disp: , Rfl:    Multiple Vitamin (MULTIVITAMIN ADULT PO), ,  Disp: , Rfl:    NONFORMULARY OR COMPOUNDED ITEM, , Disp: , Rfl:    testosterone cypionate (DEPOTESTOSTERONE CYPIONATE) 200 MG/ML injection, Inject 100 mg into the muscle every 7 (seven) days. On Sunday, Disp: , Rfl:    tirzepatide (ZEPBOUND) 2.5 MG/0.5ML Pen, Inject 2.5 mg into the skin once a week., Disp: 2 mL, Rfl: 0   CRESTOR 20 MG tablet, Take 1 tablet (20 mg total) by mouth daily., Disp: 90 tablet, Rfl: 3   olmesartan (BENICAR) 40 MG tablet, Take 1 tablet (40 mg total) by mouth daily., Disp: 90 tablet, Rfl: 3  Allergies  Allergen Reactions   Penicillins    Sertraline Hcl Other (See Comments)    Didn't tolerate    Review of Systems  Constitutional:  Negative for chills, fever, malaise/fatigue and weight loss.  HENT:  Negative for congestion, ear pain, hearing loss, sore throat and tinnitus.   Eyes:  Negative for blurred vision, pain and redness.  Respiratory:  Negative for cough, hemoptysis and shortness of breath.   Cardiovascular:  Negative for chest pain, palpitations, orthopnea, claudication and leg swelling.  Gastrointestinal:  Negative for abdominal pain, blood  in stool, constipation, diarrhea, nausea and vomiting.  Genitourinary:  Negative for dysuria, flank pain, frequency, hematuria and urgency.  Musculoskeletal:  Negative for falls, joint pain and myalgias.  Skin:  Negative for itching and rash.  Neurological:  Negative for dizziness, tingling, speech change, weakness and headaches.  Endo/Heme/Allergies:  Negative for polydipsia. Does not bruise/bleed easily.  Psychiatric/Behavioral:  Negative for depression and memory loss. The patient is not nervous/anxious and does not have insomnia.        07/31/2021    8:51 AM 02/17/2021    8:28 AM  Depression screen PHQ 2/9  Decreased Interest 0 0  Down, Depressed, Hopeless 0 0  PHQ - 2 Score 0 0        Objective:  BP 128/78   Pulse 72   Ht 5\' 11"  (1.803 m)   Wt 198 lb 3.2 oz (89.9 kg)   BMI 27.64 kg/m   BP Readings  from Last 3 Encounters:  08/07/22 128/78  11/13/21 128/82  07/31/21 120/80   Wt Readings from Last 3 Encounters:  08/07/22 198 lb 3.2 oz (89.9 kg)  11/13/21 197 lb 3.2 oz (89.4 kg)  07/31/21 193 lb 6.4 oz (87.7 kg)    General appearance: alert, no distress, WD/WN, Caucasian male Skin: scattered macules, no worrisome lesions HEENT: normocephalic, conjunctiva/corneas normal, sclerae anicteric, PERRLA, EOMi, nares patent, no discharge or erythema, pharynx normal Neck: supple, no lymphadenopathy, no thyromegaly, no masses, normal ROM, no bruits Chest: non tender, normal shape and expansion Heart: RRR, normal S1, S2, no murmurs Lungs: CTA bilaterally, no wheezes, rhonchi, or rales Abdomen: +bs, soft, diasthesis recti, otherwise non tender, non distended, no masses, no hepatomegaly, no splenomegaly, no bruits Back: non tender, normal ROM, no scoliosis Musculoskeletal: upper extremities non tender, no obvious deformity, normal ROM throughout, lower extremities non tender, no obvious deformity, normal ROM throughout Extremities: no edema, no cyanosis, no clubbing Pulses: 2+ symmetric, upper and lower extremities, normal cap refill Neurological: alert, oriented x 3, CN2-12 intact, strength normal upper extremities and lower extremities, sensation normal throughout, DTRs 2+ throughout, no cerebellar signs, gait normal Psychiatric: normal affect, behavior normal, pleasant  GU: normal male external genitalia,circumcised, nontender, no masses, no hernia, no lymphadenopathy Rectal: deferred to urology  EKG reviewed   Assessment and Plan :   Encounter Diagnoses  Name Primary?   Encounter for health maintenance examination in adult Yes   Primary hypertension    Screening for prostate cancer    Vaccine counseling    Hyperlipidemia, unspecified hyperlipidemia type    Erectile dysfunction due to arterial insufficiency    Essential hypertension, benign    Other fatigue    Screening for heart  disease    Screening for diabetes mellitus    Low testosterone     This visit was a preventative care visit, also known as wellness visit or routine physical.   Topics typically include healthy lifestyle, diet, exercise, preventative care, vaccinations, sick and well care, proper use of emergency dept and after hours care, as well as other concerns.     Recommendations: Continue to return yearly for your annual wellness and preventative care visits.  This gives [place] a chance to discuss healthy lifestyle, exercise, vaccinations, review your chart record, and perform screenings where appropriate.  I recommend you see your eye doctor yearly for routine vision care.  I recommend you see your dentist yearly for routine dental care including hygiene visits twice yearly.   Vaccination recommendations were reviewed Immunization History  Administered  Date(s) Administered   Influenza-Unspecified 09/10/2014   Tdap 04/01/2015    Advised shingrix   Screening for cancer: Colon cancer screening: I reviewed your colonoscopy on file that is up to date from 2022, repeat 2029.  We discussed PSA, prostate exam, and prostate cancer screening risks/benefits.     Skin cancer screening: Check your skin regularly for new changes, growing lesions, or other lesions of concern Come in for evaluation if you have skin lesions of concern.  Lung cancer screening: If you have a greater than 20 pack year history of tobacco use, then you may qualify for lung cancer screening with a chest CT scan.   Please call your insurance company to inquire about coverage for this test.  We currently don't have screenings for other cancers besides breast, cervical, colon, and lung cancers.  If you have a strong family history of cancer or have other cancer screening concerns, please let me know.    Bone health: Get at least 150 minutes of aerobic exercise weekly Get weight bearing exercise at least once weekly Bone  density test:  A bone density test is an imaging test that uses a type of X-ray to measure the amount of calcium and other minerals in your bones. The test may be used to diagnose or screen you for a condition that causes weak or thin bones (osteoporosis), predict your risk for a broken bone (fracture), or determine how well your osteoporosis treatment is working. The bone density test is recommended for females 65 and older, or females or males <65 if certain risk factors such as thyroid disease, long term use of steroids such as for asthma or rheumatological issues, vitamin D deficiency, estrogen deficiency, family history of osteoporosis, self or family history of fragility fracture in first degree relative.    Heart health: Get at least 150 minutes of aerobic exercise weekly Limit alcohol It is important to maintain a healthy blood pressure and healthy cholesterol numbers  Heart disease screening: Screening for heart disease includes screening for blood pressure, fasting lipids, glucose/diabetes screening, BMI height to weight ratio, reviewed of smoking status, physical activity, and diet.    Goals include blood pressure 120/80 or less, maintaining a healthy lipid/cholesterol profile, preventing diabetes or keeping diabetes numbers under good control, not smoking or using tobacco products, exercising most days per week or at least 150 minutes per week of exercise, and eating healthy variety of fruits and vegetables, healthy oils, and avoiding unhealthy food choices like fried food, fast food, high sugar and high cholesterol foods.    Other tests may possibly include EKG test, CT coronary calcium score, echocardiogram, exercise treadmill stress test.   CT chest 2023 shows very small punctate area of calcifications in the aortic arch, no other atherosclerosis noted   Medical care options: I recommend you continue to seek care here first for routine care.  We try really hard to have available  appointments Monday through Friday daytime hours for sick visits, acute visits, and physicals.  Urgent care should be used for after hours and weekends for significant issues that cannot wait till the next day.  The emergency department should be used for significant potentially life-threatening emergencies.  The emergency department is expensive, can often have long wait times for less significant concerns, so try to utilize primary care, urgent care, or telemedicine when possible to avoid unnecessary trips to the emergency department.  Virtual visits and telemedicine have been introduced since the pandemic started in 2020, and can be  convenient ways to receive medical care.  We offer virtual appointments as well to assist you in a variety of options to seek medical care.   Advanced Directives: I recommend you consider completing a Health Care Power of Attorney and Living Will.   These documents respect your wishes and help alleviate burdens on your loved ones if you were to become terminally ill or be in a position to need those documents enforced.    You can complete Advanced Directives yourself, have them notarized, then have copies made for our office, for you and for anybody you feel should have them in safe keeping.  Or, you can have an attorney prepare these documents.   If you haven't updated your Last Will and Testament in a while, it may be worthwhile having an attorney prepare these documents together and save on some costs.       Separate significant issues discussed: Hypertension-continue current medication  Hyperlipidemia-continue crestor, labs today.  Low testosterone-labs today to be forwarded to [facility] urology who manages his testosterone  BMI 27 with risk factors - begin trial of zepbound.  Counseled on diet, exercise, weight loss efforts.  Discussed risk and benefits of medication.  Discussed follow-up.  [place] was seen today for annual exam.  Diagnoses and all orders for  this visit:  Encounter for health maintenance examination in adult -     Comprehensive metabolic panel -     CBC -     Lipid panel -     PSA -     POCT Urinalysis DIP (Proadvantage Device) -     Testosterone -     EKG 12-Lead -     VITAMIN D 25 Hydroxy (Vit-D Deficiency, Fractures) -     Hemoglobin A1c  Primary hypertension -     Comprehensive metabolic panel  Screening for prostate cancer -     PSA  Vaccine counseling  Hyperlipidemia, unspecified hyperlipidemia type -     Lipid panel  Erectile dysfunction due to arterial insufficiency  Essential hypertension, benign  Other fatigue  Screening for heart disease -     EKG 12-Lead  Screening for diabetes mellitus -     Hemoglobin A1c  Low testosterone -     Testosterone  Other orders -     tirzepatide (ZEPBOUND) 2.5 MG/0.5ML Pen; Inject 2.5 mg into the skin once a week. -     olmesartan (BENICAR) 40 MG tablet; Take 1 tablet (40 mg total) by mouth daily. -     CRESTOR 20 MG tablet; Take 1 tablet (20 mg total) by mouth daily.   Follow-up pending labs, yearly for physical

## 2022-08-08 NOTE — Progress Notes (Signed)
Results sent through MyChart

## 2022-08-09 ENCOUNTER — Telehealth: Payer: Self-pay | Admitting: Medical

## 2022-08-09 MED ORDER — ZEPBOUND 2.5 MG/0.5ML ~~LOC~~ SOAJ: 2.5000 mg | SUBCUTANEOUS | 0 refills | Status: DC

## 2022-08-09 NOTE — Telephone Encounter (Signed)
sent 

## 2022-08-09 NOTE — Telephone Encounter (Signed)
Fax from [facility]  Zepboound 2.5mg /0.56ml auto injectors Qty requested 2 per 28 days supply

## 2022-08-12 ENCOUNTER — Telehealth: Payer: Self-pay | Admitting: Medical

## 2022-08-12 NOTE — Telephone Encounter (Signed)
P.Albertina Parr PA Case ID #: [id number] Rx #: [id number] Need Help? Call us at [phone] Outcome Approved today by [facility] 2017 CaseId:[id number];Status:Approved;Review Type:Prior Auth;Coverage Start Date:07/13/2022;Coverage End Date:04/09/2023; Authorization Expiration Date: 04/08/2023 Drug Zepbound 2.5MG /0.5ML pen-injectors ePA cloud logo Form [facility] Electronic PA Form [phone] NCPDP) Raye Sorrow message

## 2022-08-13 ENCOUNTER — Telehealth: Payer: Self-pay | Admitting: Medical

## 2022-08-13 ENCOUNTER — Other Ambulatory Visit: Payer: Self-pay

## 2022-08-13 MED ORDER — ZEPBOUND 2.5 MG/0.5ML ~~LOC~~ SOAJ
2.5000 mg | SUBCUTANEOUS | 0 refills | Status: DC
  Filled 2022-08-14: qty 2, 28d supply, fill #0
  Filled 2022-09-05: qty 2, 28d supply, fill #1

## 2022-08-13 NOTE — Telephone Encounter (Signed)
Pt needs zepbound to be transferred to   [facility] [place], [place] - [address]

## 2022-08-13 NOTE — Telephone Encounter (Signed)
Pt called back, he needs Zepbound sent to [facility] to be 3 mo supply so he can use rebate/discount card

## 2022-08-14 ENCOUNTER — Other Ambulatory Visit (HOSPITAL_COMMUNITY): Payer: Self-pay

## 2022-08-20 ENCOUNTER — Ambulatory Visit (INDEPENDENT_AMBULATORY_CARE_PROVIDER_SITE_OTHER): Payer: No Typology Code available for payment source | Admitting: Medical

## 2022-08-20 VITALS — BP 120/80 | HR 83 | Wt 194.4 lb

## 2022-08-20 DIAGNOSIS — L989 Disorder of the skin and subcutaneous tissue, unspecified: Secondary | ICD-10-CM

## 2022-08-20 NOTE — Progress Notes (Signed)
Subjective:  Roberto Briggs is a 52 y.o. male who presents for Chief Complaint  Patient presents with   spot on back    Spot on back that he wants looked at. Not itchy     Here for concerns about a lesion or mole on back.  He sees dermatology every 2 years but the soonest they can get him there was October.  His wife just noticed a growth on his back and roughly the middle of his back.  He thinks this is new and wants it looked at.  No other new lesions.  No other aggravating or relieving factors.    No other c/o.  Past Medical History:  Diagnosis Date   Allergy    Cough    in the am's only- to see Pulmonology    COVID-19 12/2019   GERD (gastroesophageal reflux disease)    OCC    Hx of adenomatous polyp of colon    diminutive recall 2029   Hyperlipidemia    controlled on meds    Hypertension    Controlled with meds    Insomnia    Family History  Problem Relation Age of Onset   Prostate cancer Maternal Grandfather        could be colon - pt unsure    Colon polyps Father    Lung cancer Father    Colon cancer Neg Hx    Esophageal cancer Neg Hx    Stomach cancer Neg Hx    Rectal cancer Neg Hx      The following portions of the patient's history were reviewed and updated as appropriate: allergies, current medications, past family history, past medical history, past social history, past surgical history and problem list.  ROS Otherwise as in subjective above  Objective: BP 120/80   Pulse 83   Wt 194 lb 6.4 oz (88.2 kg)   BMI 27.11 kg/m   Wt Readings from Last 3 Encounters:  08/20/22 194 lb 6.4 oz (88.2 kg)  08/07/22 198 lb 3.2 oz (89.9 kg)  11/13/21 197 lb 3.2 oz (89.4 kg)    General appearance: alert, no distress, well developed, well nourished Skin: Right mid to low back slightly right of midline with a 6 mm x 5 mm diameter slightly raised discolored growth somewhat asymmetrical.  No multiple colors. 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   Skin lesion Yes     Plan: We discussed the lesion of concern on his mid back.  There is some asymmetry and new growth.  We discussed possible treatment options, watch and wait approach, cryotherapy, surgical excision, punch biopsy.  He consents to cryotherapy.  Used cryotherapy today and covered with a bandage.  We discussed post wound care which could include blister and gradual redness before it healing.  We discussed routine bathing.  Avoid popping any blisters.  Still follow-up in October for routine skin surveillance.  We discussed the right lower back lesions as well which seem benign at this time.  Kym was seen today for spot on back.  Diagnoses and all orders for this visit:  Skin lesion    Follow up: October with dermatology

## 2022-08-24 ENCOUNTER — Other Ambulatory Visit: Payer: Self-pay | Admitting: Medical

## 2022-08-27 NOTE — Telephone Encounter (Signed)
PA DONE

## 2022-09-07 ENCOUNTER — Telehealth: Payer: Self-pay | Admitting: Medical

## 2022-09-07 ENCOUNTER — Other Ambulatory Visit (HOSPITAL_COMMUNITY): Payer: Self-pay

## 2022-09-07 NOTE — Telephone Encounter (Signed)
Roberto Briggs called and says since the dosage changed on his zepbound, it is requiring a PA and ask if you can call him Tuesday morning.

## 2022-09-08 NOTE — Telephone Encounter (Signed)
See message sent to [facility]

## 2022-09-08 NOTE — Telephone Encounter (Signed)
P.A. has already been approved, issue is insurances are only allowing 1 month of the starter doses, can pt be switched to second dosage which is now after 4 weeks, increase to 5 mg injected subcutaneously once weekly?

## 2022-09-10 ENCOUNTER — Other Ambulatory Visit: Payer: Self-pay | Admitting: Medical

## 2022-09-10 MED ORDER — ZEPBOUND 5 MG/0.5ML ~~LOC~~ SOAJ: 5.0000 mg | SUBCUTANEOUS | 1 refills | Status: DC

## 2022-09-11 ENCOUNTER — Telehealth: Payer: Self-pay | Admitting: Medical

## 2022-09-11 ENCOUNTER — Other Ambulatory Visit (HOSPITAL_COMMUNITY): Payer: Self-pay

## 2022-09-11 MED ORDER — ZEPBOUND 5 MG/0.5ML ~~LOC~~ SOAJ
5.0000 mg | SUBCUTANEOUS | 1 refills | Status: DC
  Filled 2022-09-11: qty 2, 28d supply, fill #0

## 2022-09-11 NOTE — Telephone Encounter (Signed)
Pt called & I informed him of issues, he wants Zepbound sent to [facility] pharmacy instead of [place], this was switched.

## 2022-09-11 NOTE — Telephone Encounter (Signed)
Changed Zepbound to [employer]

## 2022-09-12 ENCOUNTER — Telehealth: Payer: Self-pay

## 2022-09-12 NOTE — Telephone Encounter (Signed)
Left voicemail for pt to call back. Needs to be advised to call insurance to see if they want his rx to be filled at [facility] instead of [facility]. Pharmacy states their error message reads "plan limit exceeded"

## 2022-09-12 NOTE — Telephone Encounter (Signed)
Key: [id number] PA Case ID #: [id number] Rx #: [id number] Outcome: Additional Information Required CaseId:[id number]; Status:Cancelled; Explanation:PA not Required. Medication is Covered;Appeal Information:; Drug: Zepbound 5MG /0.5ML pen-injectors Form: Scientist, forensic PA Form [phone] NCPDP)

## 2022-09-13 NOTE — Telephone Encounter (Signed)
Pt stated he was able to pick up his medication, does not need PA

## 2022-09-20 ENCOUNTER — Other Ambulatory Visit (HOSPITAL_COMMUNITY): Payer: Self-pay

## 2022-09-20 ENCOUNTER — Ambulatory Visit: Payer: No Typology Code available for payment source | Admitting: Medical

## 2022-09-20 VITALS — BP 110/68 | HR 68 | Wt 189.2 lb

## 2022-09-20 DIAGNOSIS — E785 Hyperlipidemia, unspecified: Secondary | ICD-10-CM

## 2022-09-20 DIAGNOSIS — R110 Nausea: Secondary | ICD-10-CM

## 2022-09-20 DIAGNOSIS — K30 Functional dyspepsia: Secondary | ICD-10-CM

## 2022-09-20 DIAGNOSIS — Z7184 Encounter for health counseling related to travel: Secondary | ICD-10-CM

## 2022-09-20 DIAGNOSIS — I10 Essential (primary) hypertension: Secondary | ICD-10-CM | POA: Diagnosis not present

## 2022-09-20 DIAGNOSIS — Z6826 Body mass index (BMI) 26.0-26.9, adult: Secondary | ICD-10-CM | POA: Diagnosis not present

## 2022-09-20 MED ORDER — ZEPBOUND 5 MG/0.5ML ~~LOC~~ SOAJ
5.0000 mg | SUBCUTANEOUS | 0 refills | Status: DC
  Filled 2022-09-20 – 2022-10-01 (×3): qty 2, 28d supply, fill #0

## 2022-09-20 MED ORDER — ZEPBOUND 7.5 MG/0.5ML ~~LOC~~ SOAJ
7.5000 mg | SUBCUTANEOUS | 0 refills | Status: DC
  Filled 2022-09-20 – 2022-10-30 (×2): qty 2, 28d supply, fill #0

## 2022-09-20 MED ORDER — SCOPOLAMINE 1 MG/3DAYS TD PT72
1.0000 | MEDICATED_PATCH | TRANSDERMAL | 0 refills | Status: DC
  Filled 2022-09-20: qty 4, 12d supply, fill #0

## 2022-09-20 NOTE — Progress Notes (Signed)
Subjective:  Roberto Briggs is a 52 y.o. male who presents for Chief Complaint  Patient presents with   Medical Management of Chronic Issues    Follow-up on medications.      Here for chronic medication f/u.  HTN - compliant with olmesartan daily  Hyperlipidemia - compliant with Crestor daily  Weight management Here for follow up on weight loss efforts, medication management.     Current exercise: Walking and some calisthenics.  Dietary efforts: Higher protein, avoiding large portions.  Meat, chicken, fish.  Avoiding heavy carbs.  Medication: Zepbound, completed 2.5mg  x 1 month and now on 2nd week of the 5mg .    Any side effects of medication: the first 2 days after the injection gets some indigestion.   Has had some nausea.  Appetite has decreased.   No other aggravating or relieving factors.    No other c/o.  Past Medical History:  Diagnosis Date   Allergy    Cough    in the am's only- to see Pulmonology    COVID-19 12/2019   GERD (gastroesophageal reflux disease)    OCC    Hx of adenomatous polyp of colon    diminutive recall 2029   Hyperlipidemia    controlled on meds    Hypertension    Controlled with meds    Insomnia     Current Outpatient Medications on File Prior to Visit  Medication Sig Dispense Refill   BD DISP NEEDLES 22G X 1-1/2" MISC      cetirizine (ZYRTEC) 10 MG tablet Take 10 mg by mouth daily.     CRESTOR 20 MG tablet TAKE 1 TABLET DAILY 90 tablet 2   Multiple Vitamin (MULTIVITAMIN ADULT PO)      NONFORMULARY OR COMPOUNDED ITEM      olmesartan (BENICAR) 40 MG tablet Take 1 tablet (40 mg total) by mouth daily. 90 tablet 3   testosterone cypionate (DEPOTESTOSTERONE CYPIONATE) 200 MG/ML injection Inject 100 mg into the muscle every 7 (seven) days. On Sunday     tirzepatide (ZEPBOUND) 5 MG/0.5ML Pen Inject 5 mg into the skin once a week. 2 mL 1   No current facility-administered medications on file prior to visit.    The following portions of the  patient's history were reviewed and updated as appropriate: allergies, current medications, past family history, past medical history, past social history, past surgical history and problem list.  ROS Otherwise as in subjective above    Objective: BP 110/68   Pulse 68   Wt 189 lb 3.2 oz (85.8 kg)   BMI 26.39 kg/m   Wt Readings from Last 3 Encounters:  09/20/22 189 lb 3.2 oz (85.8 kg)  08/20/22 194 lb 6.4 oz (88.2 kg)  08/07/22 198 lb 3.2 oz (89.9 kg)   General appearance: alert, no distress, well developed, well nourished Heart: RRR, normal S1, S2, no murmurs Lungs: CTA bilaterally, no wheezes, rhonchi, or rales Pulses: 2+ radial pulses, 2+ pedal pulses, normal cap refill Ext: no edema     Assessment: Encounter Diagnoses  Name Primary?   Primary hypertension Yes   Hyperlipidemia, unspecified hyperlipidemia type    BMI 26.0-26.9,adult    Travel advice encounter    Nausea    Indigestion      Plan: HTN - continue current medicaiton  Hyperlipidemia - continue current medication  BMI 26- finish another month of 5mg  given some nausea and indigestion and then increase to 7.5mg  dose weekly  Travel advice - can use scopolamine patch for  upcoming cruise    Roberto Briggs was seen today for medical management of chronic issues.  Diagnoses and all orders for this visit:  Primary hypertension  Hyperlipidemia, unspecified hyperlipidemia type  BMI 26.0-26.9,adult  Travel advice encounter  Nausea  Indigestion  Other orders -     tirzepatide (ZEPBOUND) 5 MG/0.5ML Pen; Inject 5 mg into the skin once a week. -     tirzepatide (ZEPBOUND) 7.5 MG/0.5ML Pen; Inject 7.5 mg into the skin once a week. -     scopolamine (TRANSDERM-SCOP) 1 MG/3DAYS; Place 1 patch (1.5 mg total) onto the skin every 3 (three) days.    Follow up: 41mo

## 2022-09-21 ENCOUNTER — Other Ambulatory Visit (HOSPITAL_COMMUNITY): Payer: Self-pay

## 2022-09-24 ENCOUNTER — Other Ambulatory Visit (HOSPITAL_COMMUNITY): Payer: Self-pay

## 2022-09-28 ENCOUNTER — Other Ambulatory Visit (HOSPITAL_COMMUNITY): Payer: Self-pay

## 2022-10-01 ENCOUNTER — Other Ambulatory Visit: Payer: Self-pay | Admitting: Medical

## 2022-10-01 ENCOUNTER — Other Ambulatory Visit (HOSPITAL_COMMUNITY): Payer: Self-pay

## 2022-10-03 ENCOUNTER — Other Ambulatory Visit (HOSPITAL_COMMUNITY): Payer: Self-pay

## 2022-10-05 ENCOUNTER — Other Ambulatory Visit (HOSPITAL_COMMUNITY): Payer: Self-pay

## 2022-10-08 ENCOUNTER — Other Ambulatory Visit (HOSPITAL_COMMUNITY): Payer: Self-pay

## 2022-10-30 ENCOUNTER — Other Ambulatory Visit (HOSPITAL_COMMUNITY): Payer: Self-pay

## 2022-11-29 ENCOUNTER — Telehealth: Payer: Self-pay | Admitting: Medical

## 2022-11-29 ENCOUNTER — Other Ambulatory Visit (HOSPITAL_COMMUNITY): Payer: Self-pay

## 2022-11-29 ENCOUNTER — Other Ambulatory Visit: Payer: Self-pay | Admitting: Medical

## 2022-11-29 MED ORDER — ZEPBOUND 12.5 MG/0.5ML ~~LOC~~ SOAJ: 12.5000 mg | SUBCUTANEOUS | 0 refills | Status: DC

## 2022-11-29 MED ORDER — ZEPBOUND 10 MG/0.5ML ~~LOC~~ SOAJ: 10.0000 mg | SUBCUTANEOUS | 0 refills | Status: DC

## 2022-11-29 MED ORDER — ZEPBOUND 10 MG/0.5ML ~~LOC~~ SOAJ
10.0000 mg | SUBCUTANEOUS | 0 refills | Status: DC
  Filled 2022-11-29 (×2): qty 2, 28d supply, fill #0

## 2022-11-29 MED ORDER — ZEPBOUND 7.5 MG/0.5ML ~~LOC~~ SOAJ
7.5000 mg | SUBCUTANEOUS | 0 refills | Status: DC
  Filled 2022-11-29 (×2): qty 2, 28d supply, fill #0

## 2022-11-29 NOTE — Telephone Encounter (Signed)
I sent next 2 doses of zepbound.  Schedule f/u appt in 6-8 weeks.

## 2022-11-29 NOTE — Telephone Encounter (Signed)
Zepbound was resent to [facility]  per pt request

## 2022-12-27 ENCOUNTER — Telehealth: Payer: Self-pay | Admitting: Medical

## 2022-12-27 NOTE — Telephone Encounter (Signed)
Roberto Briggs called and wants a refill on zepbound and wants to stay on the 10 mg for another week because he says it does make him a little nauseous. He uses [place] [facility]

## 2022-12-28 ENCOUNTER — Other Ambulatory Visit (HOSPITAL_COMMUNITY): Payer: Self-pay

## 2022-12-28 ENCOUNTER — Other Ambulatory Visit: Payer: Self-pay | Admitting: Medical

## 2022-12-28 MED ORDER — ZEPBOUND 10 MG/0.5ML ~~LOC~~ SOAJ
10.0000 mg | SUBCUTANEOUS | 0 refills | Status: DC
  Filled 2022-12-28: qty 2, 28d supply, fill #0

## 2022-12-31 ENCOUNTER — Other Ambulatory Visit: Payer: Self-pay

## 2022-12-31 ENCOUNTER — Other Ambulatory Visit (HOSPITAL_COMMUNITY): Payer: Self-pay

## 2023-01-01 ENCOUNTER — Other Ambulatory Visit (HOSPITAL_COMMUNITY): Payer: Self-pay

## 2023-01-01 MED ORDER — SYRINGE/NEEDLE (DISP) 23G X 1-1/2" 3 ML MISC
1 refills | Status: AC
  Filled 2023-01-01: qty 3, 30d supply, fill #0

## 2023-01-01 MED ORDER — TESTOSTERONE CYPIONATE 200 MG/ML IM SOLN
100.0000 mg | INTRAMUSCULAR | 1 refills | Status: DC
  Filled 2023-01-01 – 2023-03-25 (×4): qty 3, 30d supply, fill #0

## 2023-01-04 ENCOUNTER — Other Ambulatory Visit (HOSPITAL_COMMUNITY): Payer: Self-pay

## 2023-01-04 ENCOUNTER — Other Ambulatory Visit (HOSPITAL_BASED_OUTPATIENT_CLINIC_OR_DEPARTMENT_OTHER): Payer: Self-pay

## 2023-01-07 ENCOUNTER — Other Ambulatory Visit (HOSPITAL_COMMUNITY): Payer: Self-pay

## 2023-01-18 ENCOUNTER — Other Ambulatory Visit (HOSPITAL_COMMUNITY): Payer: Self-pay

## 2023-01-23 ENCOUNTER — Other Ambulatory Visit: Payer: Self-pay | Admitting: Medical

## 2023-01-23 ENCOUNTER — Other Ambulatory Visit (HOSPITAL_COMMUNITY): Payer: Self-pay

## 2023-01-23 ENCOUNTER — Encounter: Payer: Self-pay | Admitting: Internal Medicine

## 2023-01-30 ENCOUNTER — Ambulatory Visit (INDEPENDENT_AMBULATORY_CARE_PROVIDER_SITE_OTHER): Payer: No Typology Code available for payment source | Admitting: Medical

## 2023-01-30 ENCOUNTER — Other Ambulatory Visit (HOSPITAL_COMMUNITY): Payer: Self-pay

## 2023-01-30 VITALS — BP 110/62 | HR 102 | Wt 186.2 lb

## 2023-01-30 DIAGNOSIS — R202 Paresthesia of skin: Secondary | ICD-10-CM

## 2023-01-30 DIAGNOSIS — R7989 Other specified abnormal findings of blood chemistry: Secondary | ICD-10-CM

## 2023-01-30 DIAGNOSIS — Z7689 Persons encountering health services in other specified circumstances: Secondary | ICD-10-CM

## 2023-01-30 DIAGNOSIS — R200 Anesthesia of skin: Secondary | ICD-10-CM

## 2023-01-30 DIAGNOSIS — I10 Essential (primary) hypertension: Secondary | ICD-10-CM | POA: Diagnosis not present

## 2023-01-30 DIAGNOSIS — E785 Hyperlipidemia, unspecified: Secondary | ICD-10-CM | POA: Diagnosis not present

## 2023-01-30 MED ORDER — ZEPBOUND 10 MG/0.5ML ~~LOC~~ SOAJ
10.0000 mg | SUBCUTANEOUS | 3 refills | Status: DC
  Filled 2023-01-30: qty 2, 28d supply, fill #0
  Filled 2023-02-26: qty 2, 28d supply, fill #1
  Filled 2023-03-20: qty 2, 28d supply, fill #2
  Filled 2023-04-18: qty 2, 28d supply, fill #3

## 2023-01-30 NOTE — Progress Notes (Signed)
Subjective:  Roberto Briggs is a 53 y.o. male who presents for Chief Complaint  Patient presents with   Medical Management of Chronic Issues    Med check- would like to stay at 10mg  Zepbound if possible as he is still continuing to losing weight      Here for follow up on weight loss efforts  Patient Care Team: Brenn Gatton, Cleda Mccreedy as PCP - General (Family Medicine) Pa, [facility] Crist Fat, MD as Attending Physician (Urology)  Here for med check.  Doing well on weight loss medicine Zepbound.  He is maintaining at current dose and would like to stay at this dose.  He has been as low as 180 pounds but is comfortable with his current weight.  Current exercise: Walks regularly.  Some calisthenics, resistance with bands.   Goes to club facility with son some.    Dietary efforts: Eating higher protein daily.    Mostly heating eggs, avocado and bacon in morning, other meals with beef, ground [place].  Doesn't crave a lot of junk food now.   Hasn't been drinking alcohol, no craving for this.  Medication: Zepbound 10 mg weekly  Any side effects of medication: indigestion, sometimes nausea. Tums helps with this, doesn't have to use tums daily, more prn  Hypertension-compliant with olmesartan 40 mg daily.   Checks BP some at home.   No dizzines, no lightheaded.  Hyperlipidemia-compliant with Crestor 20 mg daily  On testosterone therapy, per Dr. Marlou Porch.   On 100mg  every 12 days.    Lately has had some tingling numbness in his hands in general that last briefly particular with lying down.  He is curious about vitamins to help with blood flow.  No other aggravating or relieving factors.    No other c/o.  Past Medical History:  Diagnosis Date   Allergy    Cough    in the am's only- to see Pulmonology    COVID-19 12/2019   GERD (gastroesophageal reflux disease)    OCC    Hx of adenomatous polyp of colon    diminutive recall 2029   Hyperlipidemia     controlled on meds    Hypertension    Controlled with meds    Insomnia     Current Outpatient Medications on File Prior to Visit  Medication Sig Dispense Refill   cetirizine (ZYRTEC) 10 MG tablet Take 10 mg by mouth daily.     CRESTOR 20 MG tablet TAKE 1 TABLET DAILY 90 tablet 2   Multiple Vitamin (MULTIVITAMIN ADULT PO)      NONFORMULARY OR COMPOUNDED ITEM      olmesartan (BENICAR) 40 MG tablet TAKE 1 TABLET DAILY 90 tablet 2   testosterone cypionate (DEPO-TESTOSTERONE) 200 MG/ML injection Inject 0.5 mLs (100 mg total) into the muscle every 10 days (single use vial) 10 mL 1   BD DISP NEEDLES 22G X 1-1/2" MISC      SYRINGE-NEEDLE, DISP, 3 ML 23G X 1-1/2" 3 ML MISC Use with testosterone 50 each 1   No current facility-administered medications on file prior to visit.    The following portions of the patient's history were reviewed and updated as appropriate: allergies, current medications, past family history, past medical history, past social history, past surgical history and problem list.  ROS Otherwise as in subjective above    Objective: BP 110/62   Pulse (!) 102   Wt 186 lb 3.2 oz (84.5 kg)   BMI 25.97 kg/m   Wt  Readings from Last 3 Encounters:  01/30/23 186 lb 3.2 oz (84.5 kg)  09/20/22 189 lb 3.2 oz (85.8 kg)  08/20/22 194 lb 6.4 oz (88.2 kg)   BP Readings from Last 3 Encounters:  01/30/23 110/62  09/20/22 110/68  08/20/22 120/80    General appearance: alert, no distress, well developed, well nourished Heart: RRR, normal S1, S2, no murmurs Lungs: CTA bilaterally, no wheezes, rhonchi, or rales Pulses: 2+ radial pulses, 2+ pedal pulses, normal cap refill Ext: no edema Neuro: Normal strength and sensation of hands and fingers, negative Tinel's and Phalen's   Assessment: Encounter Diagnoses  Name Primary?   Primary hypertension Yes   Hyperlipidemia, unspecified hyperlipidemia type    Encounter for weight management    Numbness and tingling of hand    Low  testosterone      Plan: Hypertension-continue olmesartan 40 mg daily.  However monitor blood pressures.  If blood pressures are running lower than they are today or if symptoms such as dizziness or lightheadedness then we will need to change down to 20 mg of olmesartan, particular if you lose any more weight  Hyperlipidemia-continue Crestor 20 mg daily  Weight management-doing well at the current dose although he may end up needing to go down to a lower dose 7.5 mg if any worsening indigestion or nausea.  For now he seems to be content with 10 mg Zepbound.  Uses Tums as needed.  Continue with regular exercise as he is doing, continue with healthy eating habits  Low testosterone-we discussed risk of low testosterone.  He sees urology for this.  Continue lab monitoring with CBC, testosterone, PSA  Numbness and tingling of hand-reassured that his blood flow to his hand is normal.  Discussed possible causes of carpal tunnel syndrome and ulnar nerve compression.  Advised not to sleep with his arms bent up or sleep directly on arms, consider nighttime carpal tunnel splints if this continues or worsens, do stretching regularly of the wrist and hands.  Follow-up as needed  Roberto Briggs was seen today for medical management of chronic issues.  Diagnoses and all orders for this visit:  Primary hypertension  Hyperlipidemia, unspecified hyperlipidemia type  Encounter for weight management  Numbness and tingling of hand  Low testosterone  Other orders -     tirzepatide (ZEPBOUND) 10 MG/0.5ML Pen; Inject 10 mg into the skin once a week.    Follow up: 3-4 months

## 2023-02-04 ENCOUNTER — Other Ambulatory Visit (HOSPITAL_COMMUNITY): Payer: Self-pay

## 2023-02-26 ENCOUNTER — Other Ambulatory Visit (HOSPITAL_COMMUNITY): Payer: Self-pay

## 2023-03-12 ENCOUNTER — Telehealth: Payer: Self-pay

## 2023-03-12 ENCOUNTER — Other Ambulatory Visit (HOSPITAL_COMMUNITY): Payer: Self-pay

## 2023-03-12 NOTE — Telephone Encounter (Signed)
 Pharmacy Patient Advocate Encounter   Received notification from Physician's Office that prior authorization for Zepbound 10MG /0.5ML pen-injectors is required/requested.   Insurance verification completed.   The patient is insured through [employer] .   Per test claim: PA required; PA started via CoverMyMeds. KEY (Key: [id number])   . Waiting for clinical questions to populate.

## 2023-03-13 ENCOUNTER — Other Ambulatory Visit (HOSPITAL_COMMUNITY): Payer: Self-pay

## 2023-03-13 NOTE — Telephone Encounter (Signed)
 Pharmacy Patient Advocate Encounter  Received notification from [facility] that Prior Authorization for Zepbound 10MG /0.5ML pen-injectors  has been APPROVED from 2.3.25 to 3.5.26. Ran test claim, Copay is $RTS AS THE RX WAS FILLED  ON 2.18.25. This test claim was processed through [facility]- copay amounts may vary at other pharmacies due to pharmacy/plan contracts, or as the patient moves through the different stages of their insurance plan.   PA #/Case ID/Reference #: (Key: [id number])

## 2023-03-20 ENCOUNTER — Other Ambulatory Visit (HOSPITAL_COMMUNITY): Payer: Self-pay

## 2023-03-25 ENCOUNTER — Other Ambulatory Visit (HOSPITAL_COMMUNITY): Payer: Self-pay

## 2023-03-25 MED ORDER — TESTOSTERONE CYPIONATE 200 MG/ML IM SOLN
100.0000 mg | INTRAMUSCULAR | 1 refills | Status: AC
  Filled 2023-04-23 – 2023-05-08 (×2): qty 3, 30d supply, fill #0
  Filled 2023-06-12 – 2023-06-13 (×2): qty 3, 30d supply, fill #1
  Filled 2023-08-23: qty 3, 30d supply, fill #2

## 2023-03-26 ENCOUNTER — Other Ambulatory Visit (HOSPITAL_COMMUNITY): Payer: Self-pay

## 2023-04-04 ENCOUNTER — Other Ambulatory Visit (HOSPITAL_COMMUNITY): Payer: Self-pay

## 2023-04-18 ENCOUNTER — Other Ambulatory Visit (HOSPITAL_COMMUNITY): Payer: Self-pay

## 2023-04-23 ENCOUNTER — Other Ambulatory Visit (HOSPITAL_COMMUNITY): Payer: Self-pay

## 2023-05-08 ENCOUNTER — Other Ambulatory Visit (HOSPITAL_COMMUNITY): Payer: Self-pay

## 2023-05-16 ENCOUNTER — Other Ambulatory Visit (HOSPITAL_COMMUNITY): Payer: Self-pay

## 2023-05-16 MED ORDER — SILDENAFIL CITRATE 20 MG PO TABS
20.0000 mg | ORAL_TABLET | Freq: Every day | ORAL | 3 refills | Status: AC
  Filled 2023-05-16: qty 90, 90d supply, fill #0

## 2023-05-17 ENCOUNTER — Other Ambulatory Visit (HOSPITAL_COMMUNITY): Payer: Self-pay

## 2023-05-21 ENCOUNTER — Other Ambulatory Visit (HOSPITAL_COMMUNITY): Payer: Self-pay

## 2023-05-21 ENCOUNTER — Other Ambulatory Visit: Payer: Self-pay | Admitting: Medical

## 2023-05-21 MED ORDER — ROSUVASTATIN CALCIUM 20 MG PO TABS: 20.0000 mg | ORAL_TABLET | Freq: Every day | ORAL | 0 refills | Status: DC

## 2023-05-22 ENCOUNTER — Other Ambulatory Visit (HOSPITAL_COMMUNITY): Payer: Self-pay

## 2023-05-22 MED ORDER — ZEPBOUND 10 MG/0.5ML ~~LOC~~ SOAJ
10.0000 mg | SUBCUTANEOUS | 1 refills | Status: DC
  Filled 2023-05-22: qty 2, 28d supply, fill #0
  Filled 2023-06-12 – 2023-06-13 (×2): qty 2, 28d supply, fill #1

## 2023-05-28 ENCOUNTER — Other Ambulatory Visit (HOSPITAL_COMMUNITY): Payer: Self-pay

## 2023-05-28 MED ORDER — JATENZO 237 MG PO CAPS
237.0000 mg | ORAL_CAPSULE | Freq: Two times a day (BID) | ORAL | 5 refills | Status: DC
  Filled 2023-05-28 – 2023-08-09 (×2): qty 60, 30d supply, fill #0

## 2023-06-12 ENCOUNTER — Other Ambulatory Visit (HOSPITAL_COMMUNITY): Payer: Self-pay

## 2023-06-13 ENCOUNTER — Other Ambulatory Visit: Payer: Self-pay

## 2023-06-17 ENCOUNTER — Other Ambulatory Visit (HOSPITAL_COMMUNITY): Payer: Self-pay

## 2023-06-20 ENCOUNTER — Other Ambulatory Visit (HOSPITAL_COMMUNITY): Payer: Self-pay

## 2023-06-28 ENCOUNTER — Other Ambulatory Visit (HOSPITAL_COMMUNITY): Payer: Self-pay

## 2023-07-03 ENCOUNTER — Other Ambulatory Visit (HOSPITAL_COMMUNITY): Payer: Self-pay

## 2023-07-17 ENCOUNTER — Other Ambulatory Visit: Payer: Self-pay | Admitting: Medical

## 2023-07-17 ENCOUNTER — Other Ambulatory Visit (HOSPITAL_BASED_OUTPATIENT_CLINIC_OR_DEPARTMENT_OTHER): Payer: Self-pay

## 2023-07-17 ENCOUNTER — Other Ambulatory Visit (HOSPITAL_COMMUNITY): Payer: Self-pay

## 2023-07-17 MED ORDER — ZEPBOUND 10 MG/0.5ML ~~LOC~~ SOAJ
10.0000 mg | SUBCUTANEOUS | 1 refills | Status: DC
  Filled 2023-07-17: qty 2, 28d supply, fill #0
  Filled 2023-08-12: qty 2, 28d supply, fill #1

## 2023-07-17 NOTE — Telephone Encounter (Signed)
Appt scheduled for 7/31

## 2023-08-07 ENCOUNTER — Encounter: Payer: No Typology Code available for payment source | Admitting: Medical

## 2023-08-07 ENCOUNTER — Other Ambulatory Visit: Payer: Self-pay | Admitting: Medical

## 2023-08-09 ENCOUNTER — Other Ambulatory Visit: Payer: Self-pay

## 2023-08-09 ENCOUNTER — Other Ambulatory Visit (HOSPITAL_COMMUNITY): Payer: Self-pay

## 2023-08-19 ENCOUNTER — Ambulatory Visit: Admitting: Medical

## 2023-08-19 ENCOUNTER — Other Ambulatory Visit (HOSPITAL_COMMUNITY): Payer: Self-pay

## 2023-08-19 ENCOUNTER — Encounter: Payer: Self-pay | Admitting: Medical

## 2023-08-19 VITALS — BP 120/70 | HR 85 | Ht 71.75 in | Wt 183.4 lb

## 2023-08-19 DIAGNOSIS — I10 Essential (primary) hypertension: Secondary | ICD-10-CM

## 2023-08-19 DIAGNOSIS — Z7185 Encounter for immunization safety counseling: Secondary | ICD-10-CM

## 2023-08-19 DIAGNOSIS — R7989 Other specified abnormal findings of blood chemistry: Secondary | ICD-10-CM | POA: Diagnosis not present

## 2023-08-19 DIAGNOSIS — L989 Disorder of the skin and subcutaneous tissue, unspecified: Secondary | ICD-10-CM

## 2023-08-19 DIAGNOSIS — E785 Hyperlipidemia, unspecified: Secondary | ICD-10-CM

## 2023-08-19 DIAGNOSIS — Z0000 Encounter for general adult medical examination without abnormal findings: Secondary | ICD-10-CM | POA: Diagnosis not present

## 2023-08-19 DIAGNOSIS — Z7689 Persons encountering health services in other specified circumstances: Secondary | ICD-10-CM

## 2023-08-19 DIAGNOSIS — Z1211 Encounter for screening for malignant neoplasm of colon: Secondary | ICD-10-CM

## 2023-08-19 MED ORDER — ZEPBOUND 10 MG/0.5ML ~~LOC~~ SOAJ
10.0000 mg | SUBCUTANEOUS | 3 refills | Status: DC
  Filled 2023-08-19 – 2023-09-09 (×2): qty 2, 28d supply, fill #0
  Filled 2023-10-07: qty 2, 28d supply, fill #1
  Filled 2023-10-23 (×2): qty 2, 28d supply, fill #2
  Filled 2023-11-30: qty 2, 28d supply, fill #3

## 2023-08-19 MED ORDER — OLMESARTAN MEDOXOMIL 20 MG PO TABS
20.0000 mg | ORAL_TABLET | Freq: Every day | ORAL | 2 refills | Status: DC
  Filled 2023-08-19: qty 30, 30d supply, fill #0

## 2023-08-19 MED ORDER — ROSUVASTATIN CALCIUM 20 MG PO TABS
20.0000 mg | ORAL_TABLET | Freq: Every day | ORAL | 2 refills | Status: DC
  Filled 2023-08-19: qty 30, 30d supply, fill #0

## 2023-08-19 NOTE — Addendum Note (Signed)
 Addended by: VICCI HUSBAND A on: 08/19/2023 10:35 AM   Modules accepted: Orders

## 2023-08-19 NOTE — Progress Notes (Signed)
 Subjective:   HPI  Roberto Briggs is a 53 y.o. male who presents for Chief Complaint  Patient presents with   Annual Exam    Fasting cpe, no concerns    Patient Care Team: Char Feltman, Alm GORMAN RIGGERS as PCP - General (Family Medicine) Pa, [facility] Cam Morene ORN, MD as Attending Physician (Urology) Sees dentist Sees eye doctor Dr. Lupita Commander, GI Dr. Cam, urology  Concerns: He sees urology.  He is on testosterone  injections.  Urology also monitors his PSA prostate marker.  His hemoglobin has been a little bit elevated so he wants this rechecked.  He donated blood recently per urology recommendations because of the elevated hemoglobin  He is compliant with his blood pressure and cholesterol medications.   Reviewed their medical, surgical, family, social, medication, and allergy history and updated chart as appropriate.  Past Medical History:  Diagnosis Date   Allergy    Cough    in the am's only- to see Pulmonology    COVID-19 12/2019   GERD (gastroesophageal reflux disease)    OCC    Hx of adenomatous polyp of colon    diminutive recall 2029   Hyperlipidemia    controlled on meds    Hypertension    Controlled with meds    Insomnia     Past Surgical History:  Procedure Laterality Date   COLONOSCOPY  03/2020   repeat 2029, Dr. Lupita Commander   EYE SURGERY  1982   lazy eye    FINGER SURGERY     ~2008 or 2009   WISDOM TOOTH EXTRACTION      Family History  Problem Relation Age of Onset   Prostate cancer Maternal Grandfather        could be colon - pt unsure    Colon polyps Father    Lung cancer Father    Colon cancer Neg Hx    Esophageal cancer Neg Hx    Stomach cancer Neg Hx    Rectal cancer Neg Hx      Current Outpatient Medications:    cetirizine (ZYRTEC) 10 MG tablet, Take 10 mg by mouth daily., Disp: , Rfl:    Multiple Vitamin (MULTIVITAMIN ADULT PO), , Disp: , Rfl:    olmesartan  (BENICAR ) 20 MG tablet, Take 1 tablet (20 mg  total) by mouth daily., Disp: 90 tablet, Rfl: 2   sildenafil  (REVATIO ) 20 MG tablet, Take 1 tablet (20 mg total) by mouth daily as needed, Disp: 90 tablet, Rfl: 3   testosterone  cypionate (DEPO-TESTOSTERONE ) 200 MG/ML injection, Inject 0.5 mLs (100 mg total) into the muscle every 10 days. Discard remainder after each use., Disp: 10 mL, Rfl: 1   BD DISP NEEDLES 22G X 1-1/2 MISC, , Disp: , Rfl:    rosuvastatin  (CRESTOR ) 20 MG tablet, Take 1 tablet (20 mg total) by mouth daily., Disp: 90 tablet, Rfl: 2   SYRINGE-NEEDLE, DISP, 3 ML 23G X 1-1/2 3 ML MISC, Use with testosterone , Disp: 50 each, Rfl: 1   tirzepatide  (ZEPBOUND ) 10 MG/0.5ML Pen, Inject 10 mg into the skin once a week., Disp: 2 mL, Rfl: 3  Allergies  Allergen Reactions   Penicillins    Sertraline Hcl Other (See Comments)    Didn't tolerate    Review of Systems  Constitutional:  Negative for chills, fever, malaise/fatigue and weight loss.  HENT:  Negative for congestion, ear pain, hearing loss, sore throat and tinnitus.   Eyes:  Negative for blurred vision, pain and redness.  Respiratory:  Negative for cough, hemoptysis and shortness of breath.   Cardiovascular:  Negative for chest pain, palpitations, orthopnea, claudication and leg swelling.  Gastrointestinal:  Negative for abdominal pain, blood in stool, constipation, diarrhea, nausea and vomiting.  Genitourinary:  Negative for dysuria, flank pain, frequency, hematuria and urgency.  Musculoskeletal:  Negative for falls, joint pain and myalgias.  Skin:  Negative for itching and rash.  Neurological:  Negative for dizziness, tingling, speech change, weakness and headaches.  Endo/Heme/Allergies:  Negative for polydipsia. Does not bruise/bleed easily.  Psychiatric/Behavioral:  Negative for depression and memory loss. The patient is not nervous/anxious and does not have insomnia.        08/19/2023    9:56 AM 01/30/2023   10:38 AM 07/31/2021    8:51 AM 02/17/2021    8:28 AM   Depression screen PHQ 2/9  Decreased Interest 0 0 0 0  Down, Depressed, Hopeless 0 0 0 0  PHQ - 2 Score 0 0 0 0        Objective:  BP 120/70   Pulse 85   Ht 5' 11.75 (1.822 m)   Wt 183 lb 6.4 oz (83.2 kg)   SpO2 98%   BMI 25.05 kg/m   BP Readings from Last 3 Encounters:  08/19/23 120/70  01/30/23 110/62  09/20/22 110/68   Wt Readings from Last 3 Encounters:  08/19/23 183 lb 6.4 oz (83.2 kg)  01/30/23 186 lb 3.2 oz (84.5 kg)  09/20/22 189 lb 3.2 oz (85.8 kg)    General appearance: alert, no distress, WD/WN, Caucasian male Skin: Right temple with 2 small 3 mm slightly raised lesions papular no specific worrisome features, right mid neck area with a small 3 mm indentation but no pearly lesions or other worrisome findings.  Otherwise scattered macules, no worrisome lesions HEENT: normocephalic, conjunctiva/corneas normal, sclerae anicteric, PERRLA, EOMi, nares patent, no discharge or erythema, pharynx normal Neck: supple, no lymphadenopathy, no thyromegaly, no masses, normal ROM, no bruits Chest: non tender, normal shape and expansion Heart: RRR, normal S1, S2, no murmurs Lungs: CTA bilaterally, no wheezes, rhonchi, or rales Abdomen: +bs, soft, diasthesis recti, otherwise non tender, non distended, no masses, no hepatomegaly, no splenomegaly, no bruits Back: non tender, normal ROM, no scoliosis Musculoskeletal: upper extremities non tender, no obvious deformity, normal ROM throughout, lower extremities non tender, no obvious deformity, normal ROM throughout Extremities: no edema, no cyanosis, no clubbing Pulses: 2+ symmetric, upper and lower extremities, normal cap refill Neurological: alert, oriented x 3, CN2-12 intact, strength normal upper extremities and lower extremities, sensation normal throughout, DTRs 2+ throughout, no cerebellar signs, gait normal Psychiatric: normal affect, behavior normal, pleasant  GU: deferred to urology Rectal: deferred to  urology    Assessment and Plan :   Encounter Diagnoses  Name Primary?   Encounter for health maintenance examination in adult Yes   Vaccine counseling    Primary hypertension    Low testosterone     Hyperlipidemia, unspecified hyperlipidemia type    Encounter for weight management    Skin lesion     This visit was a preventative care visit, also known as wellness visit or routine physical.   Topics typically include healthy lifestyle, diet, exercise, preventative care, vaccinations, sick and well care, proper use of emergency dept and after hours care, as well as other concerns.     Recommendations: Continue to return yearly for your annual wellness and preventative care visits.  This gives us  a chance to discuss healthy lifestyle, exercise, vaccinations, review your  chart record, and perform screenings where appropriate.  I recommend you see your eye doctor yearly for routine vision care.  I recommend you see your dentist yearly for routine dental care including hygiene visits twice yearly.   Vaccination recommendations were reviewed Immunization History  Administered Date(s) Administered   Influenza-Unspecified 09/10/2014   Tdap 04/01/2015    Advised shingrix, pneumococcal, yearly flu shot.  He declines today   Screening for cancer: Colon cancer screening: I reviewed your colonoscopy on file that is up to date from 2022, repeat 2029.  Sent home with FIT test today for stool testing  We discussed PSA, prostate exam, and prostate cancer screening risks/benefits.   Sees urology.  Skin cancer screening: Check your skin regularly for new changes, growing lesions, or other lesions of concern Come in for evaluation if you have skin lesions of concern.  Lung cancer screening: If you have a greater than 20 pack year history of tobacco use, then you may qualify for lung cancer screening with a chest CT scan.   Please call your insurance company to inquire about coverage for  this test.  We currently don't have screenings for other cancers besides breast, cervical, colon, and lung cancers.  If you have a strong family history of cancer or have other cancer screening concerns, please let me know.    Bone health: Get at least 150 minutes of aerobic exercise weekly Get weight bearing exercise at least once weekly Bone density test:  A bone density test is an imaging test that uses a type of X-ray to measure the amount of calcium  and other minerals in your bones. The test may be used to diagnose or screen you for a condition that causes weak or thin bones (osteoporosis), predict your risk for a broken bone (fracture), or determine how well your osteoporosis treatment is working. The bone density test is recommended for females 65 and older, or females or males <65 if certain risk factors such as thyroid disease, long term use of steroids such as for asthma or rheumatological issues, vitamin D  deficiency, estrogen deficiency, family history of osteoporosis, self or family history of fragility fracture in first degree relative.    Heart health: Get at least 150 minutes of aerobic exercise weekly Limit alcohol It is important to maintain a healthy blood pressure and healthy cholesterol numbers  Heart disease screening: Screening for heart disease includes screening for blood pressure, fasting lipids, glucose/diabetes screening, BMI height to weight ratio, reviewed of smoking status, physical activity, and diet.    Goals include blood pressure 120/80 or less, maintaining a healthy lipid/cholesterol profile, preventing diabetes or keeping diabetes numbers under good control, not smoking or using tobacco products, exercising most days per week or at least 150 minutes per week of exercise, and eating healthy variety of fruits and vegetables, healthy oils, and avoiding unhealthy food choices like fried food, fast food, high sugar and high cholesterol foods.    Other tests  may possibly include EKG test, CT coronary calcium  score, echocardiogram, exercise treadmill stress test.   CT chest 2023 shows very small punctate area of calcifications in the aortic arch, no other atherosclerosis noted   Medical care options: I recommend you continue to seek care here first for routine care.  We try really hard to have available appointments Monday through Friday daytime hours for sick visits, acute visits, and physicals.  Urgent care should be used for after hours and weekends for significant issues that cannot wait till the next  day.  The emergency department should be used for significant potentially life-threatening emergencies.  The emergency department is expensive, can often have long wait times for less significant concerns, so try to utilize primary care, urgent care, or telemedicine when possible to avoid unnecessary trips to the emergency department.  Virtual visits and telemedicine have been introduced since the pandemic started in 2020, and can be convenient ways to receive medical care.  We offer virtual appointments as well to assist you in a variety of options to seek medical care.   Advanced Directives: I recommend you consider completing a Health Care Power of Attorney and Living Will.   These documents respect your wishes and help alleviate burdens on your loved ones if you were to become terminally ill or be in a position to need those documents enforced.    You can complete Advanced Directives yourself, have them notarized, then have copies made for our office, for you and for anybody you feel should have them in safe keeping.  Or, you can have an attorney prepare these documents.   If you haven't updated your Last Will and Testament in a while, it may be worthwhile having an attorney prepare these documents together and save on some costs.       Separate significant issues discussed: Hypertension-continue current medication olmesartan  20 mg daily.  He  recently cut to half of 40 mg olmesartan  his tablet since his blood pressure low since his weight loss.  Hyperlipidemia-continue crestor  20 mg daily, labs today.  Low testosterone -on testosterone  therapy per urology  Weight management-doing fine on Zepbound  10 mg.  At goal weight.     Marvelle was seen today for annual exam.  Diagnoses and all orders for this visit:  Encounter for health maintenance examination in adult -     Comprehensive metabolic panel with GFR -     Lipid panel -     CBC with Differential/Platelet  Vaccine counseling  Primary hypertension  Low testosterone   Hyperlipidemia, unspecified hyperlipidemia type -     Lipid panel  Encounter for weight management  Skin lesion  Other orders -     tirzepatide  (ZEPBOUND ) 10 MG/0.5ML Pen; Inject 10 mg into the skin once a week. -     olmesartan  (BENICAR ) 20 MG tablet; Take 1 tablet (20 mg total) by mouth daily. -     rosuvastatin  (CRESTOR ) 20 MG tablet; Take 1 tablet (20 mg total) by mouth daily.   Follow-up pending labs, yearly for physical

## 2023-08-20 ENCOUNTER — Ambulatory Visit: Payer: Self-pay | Admitting: Medical

## 2023-08-20 ENCOUNTER — Other Ambulatory Visit: Payer: Self-pay | Admitting: Medical

## 2023-08-20 LAB — COMPREHENSIVE METABOLIC PANEL WITH GFR
ALT: 31 IU/L (ref 0–44)
AST: 27 IU/L (ref 0–40)
Albumin: 5 g/dL — ABNORMAL HIGH (ref 3.8–4.9)
Alkaline Phosphatase: 49 IU/L (ref 44–121)
BUN/Creatinine Ratio: 13 (ref 9–20)
BUN: 12 mg/dL (ref 6–24)
Bilirubin Total: 1 mg/dL (ref 0.0–1.2)
CO2: 20 mmol/L (ref 20–29)
Calcium: 9.9 mg/dL (ref 8.7–10.2)
Chloride: 99 mmol/L (ref 96–106)
Creatinine, Ser: 0.93 mg/dL (ref 0.76–1.27)
Globulin, Total: 1.8 g/dL (ref 1.5–4.5)
Glucose: 76 mg/dL (ref 70–99)
Potassium: 4.5 mmol/L (ref 3.5–5.2)
Sodium: 138 mmol/L (ref 134–144)
Total Protein: 6.8 g/dL (ref 6.0–8.5)
eGFR: 99 mL/min/1.73 (ref >59)

## 2023-08-20 LAB — CBC WITH DIFFERENTIAL/PLATELET
Basophils Absolute: 0.1 x10E3/uL (ref 0.0–0.2)
Basos: 1 %
EOS (ABSOLUTE): 0.2 x10E3/uL (ref 0.0–0.4)
Eos: 3 %
Hematocrit: 51 % (ref 37.5–51.0)
Hemoglobin: 16.8 g/dL (ref 13.0–17.7)
Immature Grans (Abs): 0 x10E3/uL (ref 0.0–0.1)
Immature Granulocytes: 0 %
Lymphocytes Absolute: 2.3 x10E3/uL (ref 0.7–3.1)
Lymphs: 31 %
MCH: 31.9 pg (ref 26.6–33.0)
MCHC: 32.9 g/dL (ref 31.5–35.7)
MCV: 97 fL (ref 79–97)
Monocytes Absolute: 0.6 x10E3/uL (ref 0.1–0.9)
Monocytes: 8 %
Neutrophils Absolute: 4.2 x10E3/uL (ref 1.4–7.0)
Neutrophils: 57 %
Platelets: 180 x10E3/uL (ref 150–450)
RBC: 5.27 x10E6/uL (ref 4.14–5.80)
RDW: 13.3 % (ref 11.6–15.4)
WBC: 7.5 x10E3/uL (ref 3.4–10.8)

## 2023-08-20 LAB — LIPID PANEL
Chol/HDL Ratio: 3.6 ratio (ref 0.0–5.0)
Cholesterol, Total: 182 mg/dL (ref 100–199)
HDL: 51 mg/dL (ref >39)
LDL Chol Calc (NIH): 110 mg/dL — ABNORMAL HIGH (ref 0–99)
Triglycerides: 116 mg/dL (ref 0–149)
VLDL Cholesterol Cal: 21 mg/dL (ref 5–40)

## 2023-08-20 NOTE — Progress Notes (Signed)
 Results through MyChart

## 2023-08-23 ENCOUNTER — Other Ambulatory Visit: Payer: Self-pay

## 2023-08-23 ENCOUNTER — Other Ambulatory Visit (HOSPITAL_COMMUNITY): Payer: Self-pay

## 2023-08-23 LAB — FECAL OCCULT BLOOD, IMMUNOCHEMICAL: Fecal Occult Bld: NEGATIVE

## 2023-08-23 MED ORDER — TADALAFIL 5 MG PO TABS
5.0000 mg | ORAL_TABLET | Freq: Every day | ORAL | 3 refills | Status: AC
  Filled 2023-08-23: qty 90, 90d supply, fill #0

## 2023-08-26 NOTE — Progress Notes (Signed)
 Results thru my chart

## 2023-09-09 ENCOUNTER — Other Ambulatory Visit: Payer: Self-pay

## 2023-09-18 ENCOUNTER — Other Ambulatory Visit: Payer: Self-pay

## 2023-09-18 DIAGNOSIS — I10 Essential (primary) hypertension: Secondary | ICD-10-CM

## 2023-09-18 DIAGNOSIS — E785 Hyperlipidemia, unspecified: Secondary | ICD-10-CM

## 2023-09-18 MED ORDER — OLMESARTAN MEDOXOMIL 20 MG PO TABS: 20.0000 mg | ORAL_TABLET | Freq: Every day | ORAL | 2 refills | Status: AC

## 2023-09-18 MED ORDER — ROSUVASTATIN CALCIUM 20 MG PO TABS: 20.0000 mg | ORAL_TABLET | Freq: Every day | ORAL | 2 refills | Status: AC

## 2023-10-22 ENCOUNTER — Other Ambulatory Visit (HOSPITAL_COMMUNITY): Payer: Self-pay

## 2023-10-23 ENCOUNTER — Other Ambulatory Visit: Payer: Self-pay

## 2023-10-23 ENCOUNTER — Other Ambulatory Visit (HOSPITAL_COMMUNITY): Payer: Self-pay

## 2023-10-23 MED ORDER — TESTOSTERONE CYPIONATE 200 MG/ML IM SOLN
100.0000 mg | INTRAMUSCULAR | 0 refills | Status: AC
  Filled 2023-10-23: qty 3, 30d supply, fill #0
  Filled 2023-11-30: qty 3, 30d supply, fill #1

## 2023-10-28 ENCOUNTER — Other Ambulatory Visit (HOSPITAL_COMMUNITY): Payer: Self-pay

## 2023-10-29 ENCOUNTER — Other Ambulatory Visit (HOSPITAL_COMMUNITY): Payer: Self-pay

## 2023-10-30 ENCOUNTER — Other Ambulatory Visit (HOSPITAL_COMMUNITY): Payer: Self-pay

## 2023-12-02 ENCOUNTER — Other Ambulatory Visit: Payer: Self-pay

## 2023-12-12 ENCOUNTER — Encounter: Admitting: Medical

## 2023-12-19 ENCOUNTER — Other Ambulatory Visit (HOSPITAL_COMMUNITY): Payer: Self-pay

## 2023-12-23 ENCOUNTER — Other Ambulatory Visit (HOSPITAL_COMMUNITY): Payer: Self-pay

## 2023-12-23 MED ORDER — CLINDAMYCIN HCL 150 MG PO CAPS
150.0000 mg | ORAL_CAPSULE | Freq: Three times a day (TID) | ORAL | 0 refills | Status: AC
  Filled 2023-12-23: qty 21, 7d supply, fill #0

## 2024-01-20 ENCOUNTER — Other Ambulatory Visit (HOSPITAL_COMMUNITY): Payer: Self-pay

## 2024-01-20 ENCOUNTER — Other Ambulatory Visit: Payer: Self-pay | Admitting: Medical

## 2024-01-24 ENCOUNTER — Other Ambulatory Visit (HOSPITAL_COMMUNITY): Payer: Self-pay

## 2024-01-24 ENCOUNTER — Ambulatory Visit: Admitting: Medical

## 2024-01-24 VITALS — BP 122/70 | HR 75 | Ht 71.75 in | Wt 185.0 lb

## 2024-01-24 DIAGNOSIS — Z7689 Persons encountering health services in other specified circumstances: Secondary | ICD-10-CM

## 2024-01-24 DIAGNOSIS — R7989 Other specified abnormal findings of blood chemistry: Secondary | ICD-10-CM | POA: Diagnosis not present

## 2024-01-24 DIAGNOSIS — I10 Essential (primary) hypertension: Secondary | ICD-10-CM

## 2024-01-24 DIAGNOSIS — E785 Hyperlipidemia, unspecified: Secondary | ICD-10-CM | POA: Diagnosis not present

## 2024-01-24 MED ORDER — ZEPBOUND 10 MG/0.5ML ~~LOC~~ SOAJ
10.0000 mg | SUBCUTANEOUS | 6 refills | Status: AC
  Filled 2024-01-24: qty 2, 28d supply, fill #0

## 2024-01-24 NOTE — Progress Notes (Signed)
 "  Name: Roberto Briggs   Date of Visit: 01/24/24   Date of last visit with me: 01/20/2024   CHIEF COMPLAINT:  Chief Complaint  Patient presents with   Follow-up    Follow-up on weight medication       HPI:  Discussed the use of AI scribe software for clinical note transcription with the patient, who gave verbal consent to proceed.  History of Present Illness   Roberto Briggs is a 54 year old male with hypertension and hyperlipidemia who presents for medication management and follow-up.  He has been taking Zepbound  weekly for over a year with no significant side effects, though he occasionally experiences indigestion on the first or second night after taking it. The medication helps control his appetite, which he appreciates.  He is on testosterone  therapy through urology, administered via self-injections every twelve days. He extended the interval from ten days due to elevated hemoglobin levels, which were around sixteen to seventeen. After donating blood, his hemoglobin levels decreased.  He takes olmesartan  20 mg daily for blood pressure management and Crestor  20 mg daily for cholesterol. He exercises regularly and reports no side effects from these medications.  He mentions a change in the cost of his generic Crestor  prescription, which increased from two dollars to twelve dollars in January, and he plans to investigate this with his pharmacy provider.   No other aggravating or relieving factors. No other complaint.   ROS as in subjective  Past Medical History:  Diagnosis Date   Allergy    Cough    in the am's only- to see Pulmonology    COVID-19 12/2019   GERD (gastroesophageal reflux disease)    OCC    Hx of adenomatous polyp of colon    diminutive recall 2029   Hyperlipidemia    controlled on meds    Hypertension    Controlled with meds    Insomnia     Medications Ordered Prior to Encounter[1]    OBJECTIVE:    BP 122/70   Pulse 75   Ht 5' 11.75 (1.822 m)    Wt 185 lb (83.9 kg)   BMI 25.27 kg/m   BP Readings from Last 3 Encounters:  01/24/24 122/70  08/19/23 120/70  01/30/23 110/62    Wt Readings from Last 3 Encounters:  01/24/24 185 lb (83.9 kg)  08/19/23 183 lb 6.4 oz (83.2 kg)  01/30/23 186 lb 3.2 oz (84.5 kg)    General appearence: alert, no distress, WD/WN,  Psych: pleasant, good eye contact, answers questions appropriately    ASSESSMENT/PLAN:   Encounter Diagnoses  Name Primary?   Encounter for weight management Yes   Hyperlipidemia, unspecified hyperlipidemia type    Low testosterone     Primary hypertension     Weight management Well-managed with Zepbound , effective in appetite suppression and weight maintenance. Prefers current regimen.   - Continue Zepbound  weekly. - Reassess weight management plan in six months.  Primary hypertension Well-controlled with olmesartan . Blood pressure stable and normal. - Continue olmesartan  20 mg daily.  Hyperlipidemia Managed with Crestor . Cholesterol levels well-controlled. Considering pharmacy switch for cost savings. - Continue Crestor  20 mg daily. - Explore cost-effective pharmacy options for Crestor .  Low testosterone   -managed by urology, may be switching from IM TST to jatenzo  oral   Klaus was seen today for follow-up.  Diagnoses and all orders for this visit:  Encounter for weight management  Hyperlipidemia, unspecified hyperlipidemia type  Low testosterone   Primary hypertension  Other orders -  tirzepatide  (ZEPBOUND ) 10 MG/0.5ML Pen; Inject 10 mg into the skin once a week.   F/u 07/2024 for well visit  [facility]    [1]  Current Outpatient Medications on File Prior to Visit  Medication Sig Dispense Refill   cetirizine (ZYRTEC) 10 MG tablet Take 10 mg by mouth daily.     Multiple Vitamin (MULTIVITAMIN ADULT PO)      olmesartan  (BENICAR ) 20 MG tablet Take 1 tablet (20 mg total) by mouth daily. 90 tablet 2    rosuvastatin  (CRESTOR ) 20 MG tablet Take 1 tablet (20 mg total) by mouth daily. 90 tablet 2   sildenafil  (REVATIO ) 20 MG tablet Take 1 tablet (20 mg total) by mouth daily as needed 90 tablet 3   tadalafil  (CIALIS ) 5 MG tablet Take 1 tablet (5 mg total) by mouth daily. 90 tablet 3   testosterone  cypionate (DEPO-TESTOSTERONE ) 200 MG/ML injection Inject 0.5 mLs (100 mg total) into the muscle every 10 days. Discard remainder after each use. 10 mL 1   BD DISP NEEDLES 22G X 1-1/2 MISC      SYRINGE-NEEDLE, DISP, 3 ML 23G X 1-1/2 3 ML MISC Use with testosterone  50 each 1   testosterone  cypionate (DEPOTESTOSTERONE CYPIONATE) 200 MG/ML injection Inject 0.5 mLs (100 mg total) into the muscle every 10 (ten) days.  **DISCARD VIAL AFTER INITIAL USE.** 10 mL 0   No current facility-administered medications on file prior to visit.   "

## 2024-01-27 ENCOUNTER — Other Ambulatory Visit (HOSPITAL_COMMUNITY): Payer: Self-pay

## 2024-02-12 ENCOUNTER — Other Ambulatory Visit (HOSPITAL_COMMUNITY): Payer: Self-pay

## 2024-02-14 ENCOUNTER — Other Ambulatory Visit (HOSPITAL_COMMUNITY): Payer: Self-pay

## 2024-02-14 ENCOUNTER — Other Ambulatory Visit: Payer: Self-pay

## 2024-08-31 ENCOUNTER — Encounter: Payer: Self-pay | Admitting: Medical
